# Patient Record
Sex: Male | Born: 1940 | Race: Black or African American | Hispanic: No | Marital: Married | Smoking: Never smoker
Health system: Southern US, Community
[De-identification: ages and names within clinical notes are randomized; demographics above are authoritative.]

## PROBLEM LIST (undated history)

## (undated) DIAGNOSIS — G4730 Sleep apnea, unspecified: Secondary | ICD-10-CM

## (undated) DIAGNOSIS — F0390 Unspecified dementia without behavioral disturbance: Secondary | ICD-10-CM

## (undated) DIAGNOSIS — I639 Cerebral infarction, unspecified: Secondary | ICD-10-CM

## (undated) DIAGNOSIS — J449 Chronic obstructive pulmonary disease, unspecified: Secondary | ICD-10-CM

## (undated) DIAGNOSIS — E119 Type 2 diabetes mellitus without complications: Secondary | ICD-10-CM

## (undated) DIAGNOSIS — I10 Essential (primary) hypertension: Secondary | ICD-10-CM

## (undated) HISTORY — PX: VASCULAR SURGERY: SHX849

---

## 2007-12-04 ENCOUNTER — Ambulatory Visit: Payer: Self-pay | Admitting: Family Medicine

## 2007-12-05 ENCOUNTER — Other Ambulatory Visit: Payer: Self-pay

## 2007-12-06 ENCOUNTER — Inpatient Hospital Stay: Payer: Self-pay | Admitting: Internal Medicine

## 2007-12-14 ENCOUNTER — Emergency Department: Payer: Self-pay | Admitting: Emergency Medicine

## 2008-01-26 ENCOUNTER — Inpatient Hospital Stay: Payer: Self-pay | Admitting: Vascular Surgery

## 2008-08-23 ENCOUNTER — Ambulatory Visit: Payer: Self-pay | Admitting: Oncology

## 2008-09-13 ENCOUNTER — Ambulatory Visit: Payer: Self-pay | Admitting: Oncology

## 2008-09-23 ENCOUNTER — Ambulatory Visit: Payer: Self-pay | Admitting: Oncology

## 2008-10-23 ENCOUNTER — Ambulatory Visit: Payer: Self-pay | Admitting: Oncology

## 2009-01-17 ENCOUNTER — Ambulatory Visit: Payer: Self-pay | Admitting: Oncology

## 2009-01-23 ENCOUNTER — Ambulatory Visit: Payer: Self-pay | Admitting: Oncology

## 2009-03-25 ENCOUNTER — Ambulatory Visit: Payer: Self-pay | Admitting: Oncology

## 2009-04-18 ENCOUNTER — Ambulatory Visit: Payer: Self-pay | Admitting: Oncology

## 2009-04-25 ENCOUNTER — Ambulatory Visit: Payer: Self-pay | Admitting: Oncology

## 2009-09-13 ENCOUNTER — Inpatient Hospital Stay: Payer: Self-pay | Admitting: Specialist

## 2011-03-12 ENCOUNTER — Ambulatory Visit: Payer: Self-pay | Admitting: Internal Medicine

## 2014-12-02 ENCOUNTER — Inpatient Hospital Stay
Admission: EM | Admit: 2014-12-02 | Discharge: 2014-12-06 | DRG: 690 | Disposition: A | Discharge status: Home / Self Care | Payer: Commercial Managed Care - HMO | Attending: Internal Medicine | Admitting: Internal Medicine

## 2014-12-02 ENCOUNTER — Emergency Department: Payer: Commercial Managed Care - HMO

## 2014-12-02 ENCOUNTER — Encounter: Payer: Self-pay | Admitting: Urgent Care

## 2014-12-02 DIAGNOSIS — R400 Somnolence: Secondary | ICD-10-CM | POA: Diagnosis not present

## 2014-12-02 DIAGNOSIS — E785 Hyperlipidemia, unspecified: Secondary | ICD-10-CM | POA: Diagnosis present

## 2014-12-02 DIAGNOSIS — J449 Chronic obstructive pulmonary disease, unspecified: Secondary | ICD-10-CM | POA: Diagnosis present

## 2014-12-02 DIAGNOSIS — J9610 Chronic respiratory failure, unspecified whether with hypoxia or hypercapnia: Secondary | ICD-10-CM | POA: Diagnosis present

## 2014-12-02 DIAGNOSIS — N400 Enlarged prostate without lower urinary tract symptoms: Secondary | ICD-10-CM | POA: Diagnosis present

## 2014-12-02 DIAGNOSIS — R7309 Other abnormal glucose: Secondary | ICD-10-CM | POA: Diagnosis present

## 2014-12-02 DIAGNOSIS — I10 Essential (primary) hypertension: Secondary | ICD-10-CM | POA: Diagnosis present

## 2014-12-02 DIAGNOSIS — I639 Cerebral infarction, unspecified: Secondary | ICD-10-CM | POA: Clinically undetermined

## 2014-12-02 DIAGNOSIS — Z888 Allergy status to other drugs, medicaments and biological substances status: Secondary | ICD-10-CM

## 2014-12-02 DIAGNOSIS — N390 Urinary tract infection, site not specified: Secondary | ICD-10-CM

## 2014-12-02 DIAGNOSIS — Z79899 Other long term (current) drug therapy: Secondary | ICD-10-CM

## 2014-12-02 DIAGNOSIS — R531 Weakness: Secondary | ICD-10-CM

## 2014-12-02 DIAGNOSIS — Z9981 Dependence on supplemental oxygen: Secondary | ICD-10-CM

## 2014-12-02 DIAGNOSIS — E119 Type 2 diabetes mellitus without complications: Secondary | ICD-10-CM

## 2014-12-02 DIAGNOSIS — Z9889 Other specified postprocedural states: Secondary | ICD-10-CM

## 2014-12-02 DIAGNOSIS — Z7982 Long term (current) use of aspirin: Secondary | ICD-10-CM

## 2014-12-02 DIAGNOSIS — G4730 Sleep apnea, unspecified: Secondary | ICD-10-CM | POA: Diagnosis present

## 2014-12-02 DIAGNOSIS — F0390 Unspecified dementia without behavioral disturbance: Secondary | ICD-10-CM | POA: Diagnosis present

## 2014-12-02 DIAGNOSIS — I69354 Hemiplegia and hemiparesis following cerebral infarction affecting left non-dominant side: Secondary | ICD-10-CM

## 2014-12-02 HISTORY — DX: Cerebral infarction, unspecified: I63.9

## 2014-12-02 HISTORY — DX: Unspecified dementia, unspecified severity, without behavioral disturbance, psychotic disturbance, mood disturbance, and anxiety: F03.90

## 2014-12-02 HISTORY — DX: Essential (primary) hypertension: I10

## 2014-12-02 HISTORY — DX: Type 2 diabetes mellitus without complications: E11.9

## 2014-12-02 HISTORY — DX: Chronic obstructive pulmonary disease, unspecified: J44.9

## 2014-12-02 HISTORY — DX: Sleep apnea, unspecified: G47.30

## 2014-12-02 LAB — COMPREHENSIVE METABOLIC PANEL
ALBUMIN: 3.3 g/dL — AB (ref 3.5–5.0)
ALK PHOS: 55 U/L (ref 38–126)
ALT: 9 U/L — AB (ref 17–63)
AST: 17 U/L (ref 15–41)
Anion gap: 13 (ref 5–15)
BILIRUBIN TOTAL: 0.4 mg/dL (ref 0.3–1.2)
BUN: 14 mg/dL (ref 6–20)
CO2: 29 mmol/L (ref 22–32)
Calcium: 9 mg/dL (ref 8.9–10.3)
Chloride: 90 mmol/L — ABNORMAL LOW (ref 101–111)
Creatinine, Ser: 0.94 mg/dL (ref 0.61–1.24)
GFR calc Af Amer: >60 mL/min (ref >60)
GFR calc non Af Amer: >60 mL/min (ref >60)
GLUCOSE: 153 mg/dL — AB (ref 65–99)
POTASSIUM: 3.6 mmol/L (ref 3.5–5.1)
Sodium: 132 mmol/L — ABNORMAL LOW (ref 135–145)
Total Protein: 8.8 g/dL — ABNORMAL HIGH (ref 6.5–8.1)

## 2014-12-02 LAB — CBC
HCT: 38.2 % — ABNORMAL LOW (ref 40.0–52.0)
Hemoglobin: 12.5 g/dL — ABNORMAL LOW (ref 13.0–18.0)
MCH: 26.1 pg (ref 26.0–34.0)
MCHC: 32.7 g/dL (ref 32.0–36.0)
MCV: 79.8 fL — AB (ref 80.0–100.0)
Platelets: 198 10*3/uL (ref 150–440)
RBC: 4.79 MIL/uL (ref 4.40–5.90)
RDW: 16.6 % — ABNORMAL HIGH (ref 11.5–14.5)
WBC: 9.9 10*3/uL (ref 3.8–10.6)

## 2014-12-02 NOTE — ED Notes (Signed)
Attempted I&O urinary cath using 14Fr catheter. Unable to pass catheter through prostate. Patient with small amount of urinary leakage noted in pull up; family reports that patient is continent. MD made aware. Will continue to attempt to collect voided specimen.

## 2014-12-02 NOTE — ED Notes (Signed)
Patient presents to ED 16 via EMS from home. Patient with reports of AMS over the last 2 days. Family reported to EMS that patient has has "ammonia smelling urine." ? Fever at home - "he just felt hot."

## 2014-12-02 NOTE — ED Provider Notes (Signed)
[facility] Emergency Department Provider Note  ____________________________________________  Time seen: 2030  I have reviewed the triage vital signs and the nursing notes.   HISTORY  Chief Complaint Altered Mental Status Weakness, decreased ambulation    HPI Justin Luna is a 74 y.o. male who has a history of a CVA in the past. He is usually able to ambulate around the house. He does not ambulate outside. He is been doing well through the past week, but this morning he developed a weakness that has been progressive and worsening through the day. The family reports this is a significant change. They report that he has difficulty standing. He usually does not need to use a walker in the house but he needed to today. He is quiet and soft-spoken in a way that is worrisome to his family and worrisome to me.     Past Medical History  Diagnosis Date  . Stroke     There are no active problems to display for this patient.   No past surgical history on file.  No current outpatient prescriptions on file.  Allergies Ciprofloxacin; Lisinopril; and Macrodantin  No family history on file.  Social History History  Substance Use Topics  . Smoking status: Never Smoker   . Smokeless tobacco: Not on file  . Alcohol Use: No    Review of Systems  Constitutional: " he felt warm" ENT: Negative for sore throat. Cardiovascular: Negative for chest pain. Respiratory: Negative for shortness of breath. Gastrointestinal: Negative for abdominal pain, vomiting and diarrhea. Genitourinary: Negative for dysuria. Musculoskeletal: positive for general weakness Skin: Negative for rash. Neurological: positive for general weakness and difficulty ambulating. History of CVA. See history of present illness   10-point ROS otherwise negative.  ____________________________________________   PHYSICAL EXAM:  VITAL SIGNS: ED Triage Vitals  Enc Vitals Group     BP  12/02/14 1932 161/94 mmHg     Pulse Rate 12/02/14 1932 94     Resp 12/02/14 1932 18     Temp 12/02/14 1932 98 F (36.7 C)     Temp Source 12/02/14 1932 Oral     SpO2 12/02/14 1928 91 %     Weight 12/02/14 1932 183 lb 12.8 oz (83.371 kg)     Height 12/02/14 1932 6' (1.829 m)     Head Cir --      Peak Flow --      Pain Score 12/02/14 1934 0     Pain Loc --      Pain Edu? --      Excl. in GC? --     Constitutional: Alert, but soft spoken and slow. ENT   Head: Normocephalic and atraumatic.   Nose: No congestion/rhinnorhea.   Mouth/Throat: Mucous membranes are moist. Cardiovascular: Normal rate, regular rhythm. Respiratory: Normal respiratory effort without tachypnea. Breath sounds are clear and equal bilaterally. No wheezes/rales/rhonchi. Gastrointestinal: Soft and nontender. No distention.  Musculoskeletal: Nontender. 4-5 over 5 strength, slow movement.  No noted edema. Neurologic:  Quiet, soft spoken, appears weak. He is able to raise his legs barely off the bed and raise his hands, but all movements are quite slow and he appears generally weak.  Skin:  Skin is warm, dry. No rash noted. Psychiatric: quiet, soft spoken, but able to answer most questions. He speaks slowly and delivered early.  ____________________________________________    LABS (pertinent positives/negatives)  CBC: WBC 9.9, hemoglobin 12.5 Metabolic panel: Sodium 132, potassium 3.6, glucose 153, renal function within normal limits, LFTs  within normal limits UA: Pending ____________________________________________   EKG  ED ECG REPORT I, Lenoard Helbert W, the attending physician, personally viewed and interpreted this ECG.   Date: 12/02/2014  EKG Time: 1947  Rate: 94  Rhythm: sinus rhythm with first-degree block  Axis: within normal limits  Intervals: PR of 210  ST&T Change: none noted   ____________________________________________    RADIOLOGY  CT head: IMPRESSION: No CT evidence of  acute intracranial hemorrhage midline shift, or mass effect.  Sequela of likely prior intraparenchymal hemorrhage of the right frontal lobe with encephalomalacia of the right caudate head and expansion of the right frontal horn.  Mild expansion of the lateral ventricular system, with confluent hypodensity along the margin of the anterior and posterior horns. Changes may reflect chronic microvascular ischemic disease, however, normal pressure hydrocephalus could have this appearance, and if there is concern for NPH, MRI may be considered.   ____________________________________________   INITIAL IMPRESSION / ASSESSMENT AND PLAN / ED COURSE  Unclear cause of this patient's global weakness, slow response, and decreased ability to ambulate. At this hour (2155), the blood tests overall are unremarkable. The head CT shows no acute changes but there are older findings. See CT report.   Currently, a urinalysis is still pending. I will add on an opponent. If there is no clear explanation, I would be worried for an intracranial cause. I will last my colleague, Dr. Shaune Pollack, to follow-up on the urinalysis but I will also speak with the hospitalist to evaluate for admission given the patient's acute change.  ____________________________________________   FINAL CLINICAL IMPRESSION(S) / ED DIAGNOSES  Final diagnoses:  Somnolence  General weakness      Darien Ramus, MD 12/02/14 2208

## 2014-12-03 ENCOUNTER — Encounter: Payer: Self-pay | Admitting: Internal Medicine

## 2014-12-03 ENCOUNTER — Inpatient Hospital Stay: Payer: Commercial Managed Care - HMO

## 2014-12-03 ENCOUNTER — Inpatient Hospital Stay
Admit: 2014-12-03 | Discharge: 2014-12-03 | Disposition: A | Discharge status: Home / Self Care | Payer: Commercial Managed Care - HMO | Attending: Internal Medicine | Admitting: Internal Medicine

## 2014-12-03 DIAGNOSIS — N400 Enlarged prostate without lower urinary tract symptoms: Secondary | ICD-10-CM | POA: Diagnosis present

## 2014-12-03 DIAGNOSIS — Z7982 Long term (current) use of aspirin: Secondary | ICD-10-CM | POA: Diagnosis not present

## 2014-12-03 DIAGNOSIS — G4730 Sleep apnea, unspecified: Secondary | ICD-10-CM | POA: Diagnosis present

## 2014-12-03 DIAGNOSIS — I10 Essential (primary) hypertension: Secondary | ICD-10-CM | POA: Diagnosis present

## 2014-12-03 DIAGNOSIS — Z888 Allergy status to other drugs, medicaments and biological substances status: Secondary | ICD-10-CM | POA: Diagnosis not present

## 2014-12-03 DIAGNOSIS — R400 Somnolence: Secondary | ICD-10-CM | POA: Diagnosis present

## 2014-12-03 DIAGNOSIS — J9610 Chronic respiratory failure, unspecified whether with hypoxia or hypercapnia: Secondary | ICD-10-CM | POA: Diagnosis present

## 2014-12-03 DIAGNOSIS — R531 Weakness: Secondary | ICD-10-CM

## 2014-12-03 DIAGNOSIS — Z9889 Other specified postprocedural states: Secondary | ICD-10-CM | POA: Diagnosis not present

## 2014-12-03 DIAGNOSIS — E785 Hyperlipidemia, unspecified: Secondary | ICD-10-CM | POA: Diagnosis present

## 2014-12-03 DIAGNOSIS — I69354 Hemiplegia and hemiparesis following cerebral infarction affecting left non-dominant side: Secondary | ICD-10-CM | POA: Diagnosis not present

## 2014-12-03 DIAGNOSIS — N390 Urinary tract infection, site not specified: Secondary | ICD-10-CM | POA: Diagnosis present

## 2014-12-03 DIAGNOSIS — F0390 Unspecified dementia without behavioral disturbance: Secondary | ICD-10-CM | POA: Diagnosis present

## 2014-12-03 DIAGNOSIS — J449 Chronic obstructive pulmonary disease, unspecified: Secondary | ICD-10-CM | POA: Diagnosis present

## 2014-12-03 DIAGNOSIS — R7309 Other abnormal glucose: Secondary | ICD-10-CM | POA: Diagnosis present

## 2014-12-03 DIAGNOSIS — Z9981 Dependence on supplemental oxygen: Secondary | ICD-10-CM | POA: Diagnosis not present

## 2014-12-03 DIAGNOSIS — Z79899 Other long term (current) drug therapy: Secondary | ICD-10-CM | POA: Diagnosis not present

## 2014-12-03 LAB — CBC
HEMATOCRIT: 35.7 % — AB (ref 40.0–52.0)
Hemoglobin: 11.5 g/dL — ABNORMAL LOW (ref 13.0–18.0)
MCH: 25.4 pg — ABNORMAL LOW (ref 26.0–34.0)
MCHC: 32.2 g/dL (ref 32.0–36.0)
MCV: 79 fL — AB (ref 80.0–100.0)
Platelets: 176 10*3/uL (ref 150–440)
RBC: 4.52 MIL/uL (ref 4.40–5.90)
RDW: 16.9 % — ABNORMAL HIGH (ref 11.5–14.5)
WBC: 9.4 10*3/uL (ref 3.8–10.6)

## 2014-12-03 LAB — BASIC METABOLIC PANEL
Anion gap: 7 (ref 5–15)
BUN: 13 mg/dL (ref 6–20)
CHLORIDE: 95 mmol/L — AB (ref 101–111)
CO2: 32 mmol/L (ref 22–32)
Calcium: 8.5 mg/dL — ABNORMAL LOW (ref 8.9–10.3)
Creatinine, Ser: 0.84 mg/dL (ref 0.61–1.24)
GFR calc non Af Amer: >60 mL/min (ref >60)
Glucose, Bld: 107 mg/dL — ABNORMAL HIGH (ref 65–99)
Potassium: 3.2 mmol/L — ABNORMAL LOW (ref 3.5–5.1)
SODIUM: 134 mmol/L — AB (ref 135–145)

## 2014-12-03 LAB — GLUCOSE, CAPILLARY
GLUCOSE-CAPILLARY: 89 mg/dL (ref 65–99)
Glucose-Capillary: 95 mg/dL (ref 65–99)
Glucose-Capillary: 94 mg/dL (ref 65–99)
Glucose-Capillary: 98 mg/dL (ref 65–99)

## 2014-12-03 LAB — CREATININE, SERUM
Creatinine, Ser: 0.84 mg/dL (ref 0.61–1.24)
GFR calc non Af Amer: >60 mL/min (ref >60)

## 2014-12-03 LAB — TSH: TSH: 1.523 u[IU]/mL (ref 0.350–4.500)

## 2014-12-03 LAB — MAGNESIUM: Magnesium: 1.6 mg/dL — ABNORMAL LOW (ref 1.7–2.4)

## 2014-12-03 MED ORDER — CLOPIDOGREL BISULFATE 75 MG PO TABS
75.0000 mg | ORAL_TABLET | Freq: Every morning | ORAL | Status: DC
  Administered 2014-12-03 – 2014-12-06 (×4): 75 mg via ORAL
  Filled 2014-12-03 (×4): qty 1

## 2014-12-03 MED ORDER — ONDANSETRON HCL 4 MG/2ML IJ SOLN: 4.0000 mg | Freq: Four times a day (QID) | INTRAMUSCULAR | Status: DC | PRN

## 2014-12-03 MED ORDER — MOMETASONE FURO-FORMOTEROL FUM 100-5 MCG/ACT IN AERO
2.0000 | INHALATION_SPRAY | Freq: Two times a day (BID) | RESPIRATORY_TRACT | Status: DC
  Administered 2014-12-03 – 2014-12-06 (×6): 2 via RESPIRATORY_TRACT
  Filled 2014-12-03 (×2): qty 8.8

## 2014-12-03 MED ORDER — PRAVASTATIN SODIUM 10 MG PO TABS
10.0000 mg | ORAL_TABLET | Freq: Every day | ORAL | Status: DC
  Administered 2014-12-03 – 2014-12-06 (×4): 10 mg via ORAL
  Filled 2014-12-03 (×4): qty 1

## 2014-12-03 MED ORDER — IPRATROPIUM BROMIDE 0.02 % IN SOLN
0.5000 mg | Freq: Four times a day (QID) | RESPIRATORY_TRACT | Status: DC
  Administered 2014-12-03 (×3): 0.5 mg via RESPIRATORY_TRACT
  Filled 2014-12-03 (×3): qty 2.5

## 2014-12-03 MED ORDER — ONDANSETRON HCL 4 MG PO TABS: 4.0000 mg | ORAL_TABLET | Freq: Four times a day (QID) | ORAL | Status: DC | PRN

## 2014-12-03 MED ORDER — METOPROLOL TARTRATE 50 MG PO TABS
50.0000 mg | ORAL_TABLET | Freq: Two times a day (BID) | ORAL | Status: DC
  Administered 2014-12-03 – 2014-12-06 (×8): 50 mg via ORAL
  Filled 2014-12-03 (×9): qty 1

## 2014-12-03 MED ORDER — TAMSULOSIN HCL 0.4 MG PO CAPS
0.4000 mg | ORAL_CAPSULE | Freq: Every day | ORAL | Status: DC
  Administered 2014-12-04 – 2014-12-05 (×2): 0.4 mg via ORAL
  Filled 2014-12-03 (×2): qty 1

## 2014-12-03 MED ORDER — POTASSIUM CHLORIDE CRYS ER 20 MEQ PO TBCR: 40.0000 meq | EXTENDED_RELEASE_TABLET | ORAL | Status: DC | PRN

## 2014-12-03 MED ORDER — LORAZEPAM 2 MG/ML IJ SOLN
INTRAMUSCULAR | Status: AC
  Administered 2014-12-03: 0.5 mg
  Filled 2014-12-03: qty 1

## 2014-12-03 MED ORDER — LORAZEPAM 1 MG PO TABS
1.0000 mg | ORAL_TABLET | Freq: Once | ORAL | Status: DC
  Administered 2014-12-03: 1 mg via ORAL

## 2014-12-03 MED ORDER — ASPIRIN EC 81 MG PO TBEC: 81.0000 mg | DELAYED_RELEASE_TABLET | Freq: Every morning | ORAL | Status: DC

## 2014-12-03 MED ORDER — SODIUM CHLORIDE 0.9 % IJ SOLN
3.0000 mL | Freq: Two times a day (BID) | INTRAMUSCULAR | Status: DC
  Administered 2014-12-03 – 2014-12-06 (×5): 3 mL via INTRAVENOUS

## 2014-12-03 MED ORDER — ACETAMINOPHEN 500 MG PO TABS
500.0000 mg | ORAL_TABLET | Freq: Four times a day (QID) | ORAL | Status: DC | PRN
Start: 2014-12-03 — End: 2014-12-06

## 2014-12-03 MED ORDER — ASPIRIN EC 81 MG PO TBEC
81.0000 mg | DELAYED_RELEASE_TABLET | Freq: Every day | ORAL | Status: DC
  Administered 2014-12-03 – 2014-12-06 (×4): 81 mg via ORAL
  Filled 2014-12-03 (×4): qty 1

## 2014-12-03 MED ORDER — MEMANTINE HCL 10 MG PO TABS
10.0000 mg | ORAL_TABLET | Freq: Two times a day (BID) | ORAL | Status: DC
  Administered 2014-12-03 – 2014-12-06 (×8): 10 mg via ORAL
  Filled 2014-12-03 (×8): qty 1

## 2014-12-03 MED ORDER — DARIFENACIN HYDROBROMIDE ER 7.5 MG PO TB24
15.0000 mg | ORAL_TABLET | Freq: Every day | ORAL | Status: DC
Start: 2014-12-03 — End: 2014-12-06
  Administered 2014-12-03 – 2014-12-05 (×4): 15 mg via ORAL
  Filled 2014-12-03 (×4): qty 2

## 2014-12-03 MED ORDER — SODIUM CHLORIDE 0.9 % IV SOLN
INTRAVENOUS | Status: AC
  Administered 2014-12-03 (×2): via INTRAVENOUS

## 2014-12-03 MED ORDER — LORAZEPAM 2 MG/ML IJ SOLN
0.5000 mg | INTRAMUSCULAR | Status: AC
  Administered 2014-12-03: 0.5 mg via INTRAVENOUS

## 2014-12-03 MED ORDER — BISACODYL 5 MG PO TBEC: 10.0000 mg | DELAYED_RELEASE_TABLET | Freq: Every day | ORAL | Status: DC | PRN

## 2014-12-03 MED ORDER — INSULIN ASPART 100 UNIT/ML ~~LOC~~ SOLN
0.0000 [IU] | Freq: Three times a day (TID) | SUBCUTANEOUS | Status: DC
  Administered 2014-12-04: 1 [IU] via SUBCUTANEOUS
  Filled 2014-12-03: qty 1

## 2014-12-03 MED ORDER — FLUTICASONE PROPIONATE 50 MCG/ACT NA SUSP
1.0000 | Freq: Every day | NASAL | Status: DC | PRN
  Filled 2014-12-03: qty 16

## 2014-12-03 MED ORDER — PANTOPRAZOLE SODIUM 40 MG PO TBEC
40.0000 mg | DELAYED_RELEASE_TABLET | Freq: Every day | ORAL | Status: DC
  Administered 2014-12-03 – 2014-12-06 (×4): 40 mg via ORAL
  Filled 2014-12-03 (×4): qty 1

## 2014-12-03 MED ORDER — TIOTROPIUM BROMIDE MONOHYDRATE 18 MCG IN CAPS
18.0000 ug | ORAL_CAPSULE | Freq: Every morning | RESPIRATORY_TRACT | Status: DC
  Administered 2014-12-03 – 2014-12-06 (×4): 18 ug via RESPIRATORY_TRACT
  Filled 2014-12-03: qty 5

## 2014-12-03 MED ORDER — ENOXAPARIN SODIUM 40 MG/0.4ML ~~LOC~~ SOLN
40.0000 mg | SUBCUTANEOUS | Status: DC
  Administered 2014-12-03 – 2014-12-06 (×4): 40 mg via SUBCUTANEOUS
  Filled 2014-12-03 (×4): qty 0.4

## 2014-12-03 NOTE — Progress Notes (Signed)
[facility] [place] at [facility]   PATIENT NAME: Justin Luna    MR#:  [id number]  DATE OF BIRTH:  Mar 25, 1941  SUBJECTIVE:  CHIEF COMPLAINT:   Chief Complaint  Patient presents with  . Altered Mental Status  Same. No improvement per family. Ambulated with walker at home on own. Is confused at baseline. Chronic left weakness from old CVA  REVIEW OF SYSTEMS:    ROS  Dementia. Cannot obtain.  DRUG ALLERGIES:   Allergies  Allergen Reactions  . Ciprofloxacin Swelling  . Lisinopril Swelling  . Macrodantin [Nitrofurantoin] Swelling    VITALS:  Blood pressure 178/96, pulse 87, temperature 98.4 F (36.9 C), temperature source Oral, resp. rate 20, height 6' (1.829 m), weight 85.866 kg (189 lb 4.8 oz), SpO2 100 %.  PHYSICAL EXAMINATION:   Physical Exam  GENERAL:  74 y.o.-year-old patient lying in the bed with no acute distress.  EYES: Pupils equal, round, reactive to light and accommodation. No scleral icterus. Extraocular muscles intact.  HEENT: Head atraumatic, normocephalic. Oropharynx and nasopharynx clear.  NECK:  Supple, no jugular venous distention. No thyroid enlargement, no tenderness.  LUNGS: Normal breath sounds bilaterally, no wheezing, rales, rhonchi. No use of accessory muscles of respiration.  CARDIOVASCULAR: S1, S2 normal. No murmurs, rubs, or gallops.  ABDOMEN: Soft, nontender, nondistended. Bowel sounds present. No organomegaly or mass.  EXTREMITIES: No cyanosis, clubbing or edema b/l.    NEUROLOGIC: Cranial nerves II through XII are intact. Right motor 4+/5. Left 5/5   PSYCHIATRIC: The patient is drowzy SKIN: No obvious rash, lesion, or ulcer.    LABORATORY PANEL:   CBC  Recent Labs Lab 12/03/14 0633  WBC 9.4  HGB 11.5*  HCT 35.7*  PLT 176   ------------------------------------------------------------------------------------------------------------------  Chemistries   Recent Labs Lab 12/02/14 1942  12/03/14 0632 12/03/14 0633  NA 132* 134*  --   K 3.6 3.2*  --   CL 90* 95*  --   CO2 29 32  --   GLUCOSE 153* 107*  --   BUN 14 13  --   CREATININE 0.94 0.84 0.84  CALCIUM 9.0 8.5*  --   MG  --   --  1.6*  AST 17  --   --   ALT 9*  --   --   ALKPHOS 55  --   --   BILITOT 0.4  --   --    ------------------------------------------------------------------------------------------------------------------  Cardiac Enzymes No results for input(s): TROPONINI in the last 168 hours. ------------------------------------------------------------------------------------------------------------------  RADIOLOGY:  Ct Head Wo Contrast  12/02/2014   CLINICAL DATA:  74 year old male with a history of altered mental status over the past 2 days  EXAM: CT HEAD WITHOUT CONTRAST  TECHNIQUE: Contiguous axial images were obtained from the base of the skull through the vertex without intravenous contrast.  COMPARISON:  None.  FINDINGS: Unremarkable appearance of the calvarium without acute fracture or aggressive lesion.  Unremarkable appearance of the scalp soft tissues.  Unremarkable appearance of the bilateral orbits.  Mastoid air cells are clear.  No significant paranasal sinus disease  No acute intracranial hemorrhage.  No midline shift or mass effect.  Confluent hypodensity at the margins of the lateral ventricles.  Atrophic right caudate head with focal hypodensity in the adjacent brain parenchyma. Expanded appearance of the frontal horn of the right lateral ventricle.  Mild dilation of the ventricular system, with bifrontal diameter measures 3.8 cm. No comparison is available.  Gray-white differentiation is relatively maintained.  IMPRESSION:  No CT evidence of acute intracranial hemorrhage midline shift, or mass effect.  Sequela of likely prior intraparenchymal hemorrhage of the right frontal lobe with encephalomalacia of the right caudate head and expansion of the right frontal horn.  Mild expansion of the  lateral ventricular system, with confluent hypodensity along the margin of the anterior and posterior horns. Changes may reflect chronic microvascular ischemic disease, however, normal pressure hydrocephalus could have this appearance, and if there is concern for NPH, MRI may be considered.  Signed,  Yvone Neu. Loreta Ave, DO  [facility]  [facility]   Electronically Signed   By: Gilmer Mor D.O.   On: 12/02/2014 21:15   US Carotid Bilateral  12/03/2014   CLINICAL DATA:  History of stroke. Weakness, hypertension, diabetes.  EXAM: BILATERAL CAROTID DUPLEX ULTRASOUND  TECHNIQUE: Wallace Cullens scale imaging, color Doppler and duplex ultrasound was performed of bilateral carotid and vertebral arteries in the neck.  COMPARISON:  12/10/2007  REVIEW OF SYSTEMS: Quantification of carotid stenosis is based on velocity parameters that correlate the residual internal carotid diameter with NASCET-based stenosis levels, using the diameter of the distal internal carotid lumen as the denominator for stenosis measurement.  The following velocity measurements were obtained:  PEAK SYSTOLIC/END DIASTOLIC  RIGHT  ICA:                     66/14cm/sec  CCA:                     58/13cm/sec  SYSTOLIC ICA/CCA RATIO:  1.2  DIASTOLIC ICA/CCA RATIO: 1.1  ECA:                     95cm/sec  LEFT  ICA:                     79 /12cm/sec  CCA:                     40/9cm/sec  SYSTOLIC ICA/CCA RATIO:  2.0  DIASTOLIC ICA/CCA RATIO: 1.3  ECA:                     490cm/sec  FINDINGS: RIGHT CAROTID ARTERY: Eccentric partially calcified plaque in the bulb and proximal ICA, without high-grade stenosis. Normal waveforms and color Doppler signal.  RIGHT VERTEBRAL ARTERY:  Normal flow direction and waveform.  LEFT CAROTID ARTERY: Eccentric nonocclusive plaque in the proximal common carotid artery. Circumferential somewhat irregular and partially calcified plaque in the carotid bulb involving proximal internal and external  carotid arteries. There is markedly elevated peak systolic velocities in the proximal external carotid artery just beyond the plaque. In the ICA, at least mild proximal stenosis but normal waveforms and color Doppler signal.  LEFT VERTEBRAL ARTERY: Normal flow direction and waveform.  IMPRESSION: 1. Bilateral carotid bifurcation and proximal ICA plaque, left greater than right, resulting in less than 50% diameter stenosis. 2. High-grade origin stenosis of the left external carotid artery, of uncertain clinical significance.   Electronically Signed   By: Corlis Leak M.D.   On: 12/03/2014 12:14     ASSESSMENT AND PLAN:   * Weakness R/o CVA. Check MRI. Check UA. PT consult. Neuro consult. SNF?  * Dementia - Watch for inpatient delirium *HTN Conitnue home meds.  * COPD with chronic resp failure On2 L O2 at home. Continue inhalers  *BPH Start flomax  * DM  *HL  All the records  are reviewed and case discussed with Care Management/Social Workerr. Management plans discussed with the patient, family and they are in agreement.  CODE STATUS: FULL CODE  DVT Prophylaxis: SCDs  TOTAL TIME TAKING CARE OF THIS PATIENT: 35 minutes.   POSSIBLE D/C IN 1-2 DAYS, DEPENDING ON CLINICAL CONDITION.   Milagros Loll R M.D on 12/03/2014 at 2:01 PM  Between 7am to 6pm - Pager - [phone]  After 6pm go to [email or web address] - password [email or web address] [facility]  [facility]  Office  [phone]  CC: Primary care physician; [facility]

## 2014-12-03 NOTE — Evaluation (Signed)
Clinical/Bedside Swallow Evaluation Patient Details  Name: Justin Luna MRN: [id number] Date of Birth: [birth year]  Today's Date: 12/03/2014 Time: SLP Start Time (ACUTE ONLY): 1230 SLP Stop Time (ACUTE ONLY): 1330 SLP Time Calculation (min) (ACUTE ONLY): 60 min  Past Medical History:  Past Medical History  Diagnosis Date  . Stroke   . Hypertension   . Diabetes mellitus without complication   . COPD (chronic obstructive pulmonary disease)   . Sleep apnea   . Dementia    Past Surgical History:  Past Surgical History  Procedure Laterality Date  . Vascular surgery     HPI:  pt's wife stated pt has not eaten much in the past 3-4 days; decreased appetite. Pt and wife denied any swallowing problems at home prior to admission.    Assessment / Plan / Recommendation Clinical Impression  Pt appeared to present w/ an adequate oropharyngeal phase swallow function w/ all trial consistencies; no overt s/s of aspiration noted w/ all trial consistencies. Pt ate and drank slowly taking his time; wife indicated this was how he ate at home. No oral phase deficits noted; pt cleared orally w/ all trials. Pt required feeding assistance. Pt appears at reduced risk for aspiration at this time following general aspiration precautions. Rec. a Dys. III w/ thin liquids diet w/ meds in puree; aspiration precautions. NSG updated.     Aspiration Risk   (reduced)    Diet Recommendation Dysphagia 3 (Mech soft);Thin   Medication Administration: Whole meds with puree Compensations: Slow rate;Small sips/bites    Other  Recommendations Recommended Consults:  (Dietician) Oral Care Recommendations: Oral care BID;Oral care before and after PO;Staff/trained caregiver to provide oral care   Follow Up Recommendations       Frequency and Duration min 2x/week  1 week   Pertinent Vitals/Pain denied    SLP Swallow Goals  see care plan   Swallow Study Prior Functional Status   pt resided at home w/ wife     General Date of Onset: 12/02/14 Other Pertinent Information: pt's wife stated pt has not eaten much in the past 3-4 days; decreased appetite. Pt and wife denied any swallowing problems at home prior to admission.  Type of Study: Bedside swallow evaluation Previous Swallow Assessment: none indicated Diet Prior to this Study: Regular;Thin liquids Temperature Spikes Noted: No Respiratory Status: Supplemental O2 delivered via (comment) (2 liters chronic) History of Recent Intubation: No Behavior/Cognition: Alert;Cooperative;Requires cueing;Confused Oral Cavity - Dentition: Adequate natural dentition/normal for age;Missing dentition Self-Feeding Abilities: Total assist (and encouragement to take po's/participate) Patient Positioning: Upright in bed Baseline Vocal Quality: Low vocal intensity Volitional Cough: Cognitively unable to elicit Volitional Swallow: Able to elicit    Oral/Motor/Sensory Function Overall Oral Motor/Sensory Function: Appears within functional limits for tasks assessed Labial ROM: Within Functional Limits Labial Symmetry: Within Functional Limits Labial Strength: Within Functional Limits Lingual ROM: Within Functional Limits Lingual Symmetry: Within Functional Limits Lingual Strength: Within Functional Limits Facial Symmetry: Within Functional Limits Velum:  (NT) Mandible: Within Functional Limits   Ice Chips Ice chips: Within functional limits Presentation: Spoon (fed by SLP;  x2 trials)   Thin Liquid Thin Liquid: Within functional limits Presentation: Straw (fed by SLP; 5 trials)    Nectar Thick Nectar Thick Liquid: Not tested   Honey Thick Honey Thick Liquid: Not tested   Puree Puree: Within functional limits Presentation: Spoon (fed by SLP; x4 trials)   Solid   GO    Solid: Within functional limits Presentation: Spoon (fed by SLP; x1  trial)       Justin Luna 12/03/2014,1:42 PM

## 2014-12-03 NOTE — Care Management (Signed)
Presents from home.  Wife and other family members present in room. At baseline, patient is able to walk in the house either without assistive device or uses his wife's stationary walker.  He has cpap but unable to determine based on wife's response at to whether patient has chronic 02 in the home.  Denies issues accessing medical care or obtaining meds.  NieceBeryle Beams [phone] provides transportation to appointments and errands.  Followed by [facility]- Delma Officer PA  and Beverely Risen MD.  Resides in [place].  Confirmed demographics and contact information

## 2014-12-03 NOTE — H&P (Signed)
[facility] - [place] at [facility]   PATIENT NAME: Justin Luna    MR#:  [id number]  DATE OF BIRTH:  1940/10/28  DATE OF ADMISSION:  12/02/2014  PRIMARY CARE PHYSICIAN: [facility]   REQUESTING/REFERRING PHYSICIAN: Janalyn Harder  CHIEF COMPLAINT:   Chief Complaint  Patient presents with  . Altered Mental Status   generalized weakness with the decreased ambulation  HISTORY OF PRESENT ILLNESS:  Justin Luna  is a 74 y.o. male with below mentioned past medical history was brought in by EMS with the complaints of found to have generalized weakness with the decreased aberration noticed in the morning today. According to the patient's wife who is with the patient at this time patient has a baseline dementia but able to take care of himself at home and and ambulates on his own. This morning he was noted to have generalized weakness with inability to walk hence needing walker and the symptoms further worsened later in the evening hence called EMS and brought to the emergency room. Denies any fever, cough, chest pain, shortness of breath, dizziness or loss of consciousness. No history of any nausea, vomiting, diarrhea, dysuria, abdominal pain. Patient is able to speak but his speech is very soft and slow in response. Denies any focal weakness or numbness. In the emergency room patient was evaluated by the ED physician and was found to be alert awake and oriented but slow in response with soft speech and neuro examination was nonfocal. Workup in the ED revealed CBC and CMP within normal limits, EKG normal sinus rhythm with ventricular rate of 94 bpm with no acute ST-T changes. CT of the head done negative for any acute intracranial pathology but positive for findings suggestive of sequelae of prior intracerebral hemorrhage and encephalomalacia. Urinalysis is pending at this time. Hospitalist service was consulted for further management. Patient did current time  is comfortably resting in the bed, alert awake and oriented 3, following commands but slow in response.  PAST MEDICAL HISTORY:   Past Medical History  Diagnosis Date  . Stroke   . Hypertension   . Diabetes mellitus without complication   . COPD (chronic obstructive pulmonary disease)   . Sleep apnea   . Dementia     PAST SURGICAL HISTORY:   Past Surgical History  Procedure Laterality Date  . Vascular surgery      SOCIAL HISTORY:   History  Substance Use Topics  . Smoking status: Never Smoker   . Smokeless tobacco: Not on file  . Alcohol Use: No    FAMILY HISTORY:  History reviewed. No pertinent family history.  DRUG ALLERGIES:   Allergies  Allergen Reactions  . Ciprofloxacin Swelling  . Lisinopril Swelling  . Macrodantin [Nitrofurantoin] Swelling    REVIEW OF SYSTEMS:   Review of Systems  Constitutional: Positive for malaise/fatigue. Negative for fever and chills.  HENT: Negative for ear pain, hearing loss, nosebleeds, sore throat and tinnitus.   Eyes: Negative for blurred vision, double vision, pain, discharge and redness.  Respiratory: Negative for cough, hemoptysis, sputum production, shortness of breath and wheezing.   Cardiovascular: Negative for chest pain, palpitations, orthopnea and leg swelling.  Gastrointestinal: Negative for nausea, vomiting, abdominal pain, diarrhea, constipation, blood in stool and melena.  Genitourinary: Negative for dysuria, urgency, frequency and hematuria.  Musculoskeletal: Negative for back pain, joint pain and neck pain.  Skin: Negative for itching and rash.  Neurological: Positive for weakness. Negative for dizziness, tingling, sensory change, focal weakness  and seizures.  Endo/Heme/Allergies: Does not bruise/bleed easily.  Psychiatric/Behavioral: Negative for depression. The patient is not nervous/anxious.     MEDICATIONS AT HOME:   Prior to Admission medications   Medication Sig Start Date End Date Taking?  Authorizing Provider  acetaminophen (TYLENOL) 500 MG tablet Take 500 mg by mouth every 6 (six) hours as needed for mild pain.   Yes Historical Provider, MD  aspirin EC 81 MG tablet Take 81 mg by mouth every morning.   Yes Historical Provider, MD  clopidogrel (PLAVIX) 75 MG tablet Take 75 mg by mouth every morning.   Yes Historical Provider, MD  fluticasone (FLONASE) 50 MCG/ACT nasal spray Place 1 spray into both nostrils daily as needed (for congestion).   Yes Historical Provider, MD  Fluticasone-Salmeterol (ADVAIR) 250-50 MCG/DOSE AEPB Inhale 1 puff into the lungs 2 (two) times daily.   Yes Historical Provider, MD  lovastatin (MEVACOR) 40 MG tablet Take 40 mg by mouth at bedtime.   Yes Historical Provider, MD  memantine (NAMENDA) 10 MG tablet Take 10 mg by mouth 2 (two) times daily.   Yes Historical Provider, MD  metoprolol (LOPRESSOR) 50 MG tablet Take 50 mg by mouth 2 (two) times daily.   Yes Historical Provider, MD  omeprazole (PRILOSEC) 40 MG capsule Take 40 mg by mouth at bedtime.   Yes Historical Provider, MD  tiotropium (SPIRIVA) 18 MCG inhalation capsule Place 18 mcg into inhaler and inhale every morning.   Yes Historical Provider, MD  traMADol (ULTRAM) 50 MG tablet Take 50 mg by mouth 2 (two) times daily as needed for moderate pain.   Yes Historical Provider, MD  Trospium Chloride 60 MG CP24 Take 1 capsule by mouth at bedtime.   Yes Historical Provider, MD      VITAL SIGNS:  Blood pressure 160/93, pulse 96, temperature 98 F (36.7 C), temperature source Oral, resp. rate 19, height 6' (1.829 m), weight 83.371 kg (183 lb 12.8 oz), SpO2 95 %.  PHYSICAL EXAMINATION:  Physical Exam  Constitutional: He is oriented to person, place, and time. He appears well-developed and well-nourished. No distress.  HENT:  Head: Normocephalic and atraumatic.  Right Ear: External ear normal.  Left Ear: External ear normal.  Nose: Nose normal.  Mouth/Throat: Oropharynx is clear and moist. No  oropharyngeal exudate.  Eyes: EOM are normal. Pupils are equal, round, and reactive to light. No scleral icterus.  Neck: Normal range of motion. Neck supple. No JVD present. No thyromegaly present.  Cardiovascular: Normal rate, regular rhythm, normal heart sounds and intact distal pulses.  Exam reveals no friction rub.   No murmur heard. Respiratory: Effort normal and breath sounds normal. No respiratory distress. He has no wheezes. He has no rales. He exhibits no tenderness.  GI: Soft. Bowel sounds are normal. He exhibits no distension and no mass. There is no tenderness. There is no rebound and no guarding.  Musculoskeletal: Normal range of motion. He exhibits no edema.  Lymphadenopathy:    He has no cervical adenopathy.  Neurological: He is alert and oriented to person, place, and time. He has normal reflexes. He displays normal reflexes. No cranial nerve deficit. He exhibits normal muscle tone.  Slow in response but following commands well.  Skin: Skin is warm. No rash noted. No erythema.  Psychiatric: He has a normal mood and affect.   LABORATORY PANEL:   CBC  Recent Labs Lab 12/02/14 1942  WBC 9.9  HGB 12.5*  HCT 38.2*  PLT 198   ------------------------------------------------------------------------------------------------------------------  Chemistries   Recent Labs Lab 12/02/14 1942  NA 132*  K 3.6  CL 90*  CO2 29  GLUCOSE 153*  BUN 14  CREATININE 0.94  CALCIUM 9.0  AST 17  ALT 9*  ALKPHOS 55  BILITOT 0.4   ------------------------------------------------------------------------------------------------------------------  Cardiac Enzymes No results for input(s): TROPONINI in the last 168 hours. ------------------------------------------------------------------------------------------------------------------  RADIOLOGY:  Ct Head Wo Contrast  12/02/2014   CLINICAL DATA:  74 year old male with a history of altered mental status over the past 2 days  EXAM:  CT HEAD WITHOUT CONTRAST  TECHNIQUE: Contiguous axial images were obtained from the base of the skull through the vertex without intravenous contrast.  COMPARISON:  None.  FINDINGS: Unremarkable appearance of the calvarium without acute fracture or aggressive lesion.  Unremarkable appearance of the scalp soft tissues.  Unremarkable appearance of the bilateral orbits.  Mastoid air cells are clear.  No significant paranasal sinus disease  No acute intracranial hemorrhage.  No midline shift or mass effect.  Confluent hypodensity at the margins of the lateral ventricles.  Atrophic right caudate head with focal hypodensity in the adjacent brain parenchyma. Expanded appearance of the frontal horn of the right lateral ventricle.  Mild dilation of the ventricular system, with bifrontal diameter measures 3.8 cm. No comparison is available.  Gray-white differentiation is relatively maintained.  IMPRESSION: No CT evidence of acute intracranial hemorrhage midline shift, or mass effect.  Sequela of likely prior intraparenchymal hemorrhage of the right frontal lobe with encephalomalacia of the right caudate head and expansion of the right frontal horn.  Mild expansion of the lateral ventricular system, with confluent hypodensity along the margin of the anterior and posterior horns. Changes may reflect chronic microvascular ischemic disease, however, normal pressure hydrocephalus could have this appearance, and if there is concern for NPH, MRI may be considered.  Signed,  Yvone Neu. Loreta Ave, DO  [facility]  [facility]   Electronically Signed   By: Gilmer Mor D.O.   On: 12/02/2014 21:15    EKG:   Orders placed or performed during the hospital encounter of 12/02/14  . ED EKG normal sinus rhythm with ventricular rate of 94 bpm, first-degree AV block. No acute ST-T changes.   . ED EKG    IMPRESSION AND PLAN:   1. Generalized weakness with difficulty in ambulation. Workup in  the ED so far negative, CT head noncontrast was negative for an acute IC pathology but positive for chronic changes with encephalomalacia. Rule out CVA,? Worsening dementia, rule out metabolic causes, doubt infectious etiology-urinalysis pending at this time 2. History of prior CVA. 3. History of dementia. 4. Hypertension stable on home medications. 5. COPD, stable on home medications. No acute problems. 6. Sleep apnea on CPAP at home and O2 supplementation at night, stable. 7. Benign prostatic hypertrophy, stable on medications. 8. Borderline diabetes mellitus type 2, diet controlled. 9. Hyperlipidemia on statin stable.  Plan: Admit to telemetry, neuro watch, nothing by mouth for now pending swallowing evaluation, continue aspirin, Plavix, statin. Order MRI of the brain, bilateral carotid Dopplers, echocardiogram. Request neuro consultation for further advice. Continue home medications for hypertension, COPD, hyperlipidemia, BPH. Sliding scale insulin, follow-up blood sugars. CPAP at night, O2 supplementation at night.    All the records are reviewed and case discussed with ED provider. Management plans discussed with the patient, family and they are in agreement.  CODE STATUS: Full code  TOTAL TIME TAKING CARE OF THIS PATIENT: 50 minutes.    Rooney Gladwin,  Gilliam Hawkes N M.D on 12/03/2014 at 12:10 AM  Between 7am to 6pm - Pager - [phone]  After 6pm go to [email or web address] - password [email or web address] [facility]  [facility]  Office  [phone]  CC: Primary care physician; [facility]

## 2014-12-03 NOTE — ED Notes (Signed)
Per admitting physician ,"ok to send pt to floor without urinalysis, give patient time to urinate on his own"

## 2014-12-03 NOTE — Consult Note (Signed)
HPI: Justin Luna is an 74 y.o. malewas brought in by EMS with the complaints of found to have generalized weakness with the decreased aberration noticed since yesterday. According to the patient's wife who is with the patient at this time patient has a baseline dementia but able to take care of himself at home and and ambulates on his own. Since yesterday he was noted to have generalized weakness with inability to walk hence needing walker and the symptoms further worsened later in the evening hence called EMS and brought to the emergency room.      Past Medical History  Diagnosis Date  . Stroke   . Hypertension   . Diabetes mellitus without complication   . COPD (chronic obstructive pulmonary disease)   . Sleep apnea   . Dementia     Past Surgical History  Procedure Laterality Date  . Vascular surgery      History reviewed. No pertinent family history. Social History:  reports that he has never smoked. He does not have any smokeless tobacco history on file. He reports that he does not drink alcohol or use illicit drugs.  Allergies:  Allergies  Allergen Reactions  . Ciprofloxacin Swelling  . Lisinopril Swelling  . Macrodantin [Nitrofurantoin] Swelling    Medications: I have reviewed the patient's current medications.   Physical Examination: Blood pressure 178/96, pulse 87, temperature 98.4 F (36.9 C), temperature source Oral, resp. rate 20, height 6' (1.829 m), weight 85.866 kg (189 lb 4.8 oz), SpO2 100 %.   Neurological Examination Mental Status: Alert, oriented, thought content appropriate.  Speech fluent without evidence of aphasia.  Able to follow 3 step commands without difficulty. Cranial Nerves: II: Discs flat bilaterally; Visual fields grossly normal, pupils equal, round, reactive to light and accommodation III,IV, VI: ptosis not present, extra-ocular motions intact bilaterally V,VII: smile symmetric, facial light touch sensation normal bilaterally VIII:  hearing normal bilaterally IX,X: gag reflex present XI: bilateral shoulder shrug XII: midline tongue extension Motor: Right : Upper extremity   4+/5    Left:     Upper extremity   4+/5  Lower extremity   4/5     Lower extremity   4/5 Tone and bulk:normal tone throughout; no atrophy noted Sensory: Pinprick and light touch intact throughout, bilaterally Deep Tendon Reflexes: 1+ and symmetric throughout Plantars: Right: downgoing   Left: downgoing Cerebellar: normal finger-to-nose, normal rapid alternating movements and normal heel-to-shin test     Laboratory Studies:  Basic Metabolic Panel:  Recent Labs Lab 12/02/14 1942 12/03/14 0632 12/03/14 0633  NA 132* 134*  --   K 3.6 3.2*  --   CL 90* 95*  --   CO2 29 32  --   GLUCOSE 153* 107*  --   BUN 14 13  --   CREATININE 0.94 0.84 0.84  CALCIUM 9.0 8.5*  --   MG  --   --  1.6*    Liver Function Tests:  Recent Labs Lab 12/02/14 1942  AST 17  ALT 9*  ALKPHOS 55  BILITOT 0.4  PROT 8.8*  ALBUMIN 3.3*   No results for input(s): LIPASE, AMYLASE in the last 168 hours. No results for input(s): AMMONIA in the last 168 hours.  CBC:  Recent Labs Lab 12/02/14 1942 12/03/14 0633  WBC 9.9 9.4  HGB 12.5* 11.5*  HCT 38.2* 35.7*  MCV 79.8* 79.0*  PLT 198 176    Cardiac Enzymes: No results for input(s): CKTOTAL, CKMB, CKMBINDEX, TROPONINI in the last 168 hours.  BNP: Invalid input(s): POCBNP  CBG:  Recent Labs Lab 12/03/14 0812 12/03/14 1341  GLUCAP 95 89    Microbiology: No results found for this or any previous visit.  Coagulation Studies: No results for input(s): LABPROT, INR in the last 72 hours.  Urinalysis: No results for input(s): COLORURINE, LABSPEC, PHURINE, GLUCOSEU, HGBUR, BILIRUBINUR, KETONESUR, PROTEINUR, UROBILINOGEN, NITRITE, LEUKOCYTESUR in the last 168 hours.  Invalid input(s): APPERANCEUR  Lipid Panel: No results found for: CHOL, TRIG, HDL, CHOLHDL, VLDL, LDLCALC  HgbA1C: No  results found for: HGBA1C  Urine Drug Screen:  No results found for: LABOPIA, COCAINSCRNUR, LABBENZ, AMPHETMU, THCU, LABBARB  Alcohol Level: No results for input(s): ETH in the last 168 hours.   Imaging: Ct Head Wo Contrast  12/02/2014   CLINICAL DATA:  74 year old male with a history of altered mental status over the past 2 days  EXAM: CT HEAD WITHOUT CONTRAST  TECHNIQUE: Contiguous axial images were obtained from the base of the skull through the vertex without intravenous contrast.  COMPARISON:  None.  FINDINGS: Unremarkable appearance of the calvarium without acute fracture or aggressive lesion.  Unremarkable appearance of the scalp soft tissues.  Unremarkable appearance of the bilateral orbits.  Mastoid air cells are clear.  No significant paranasal sinus disease  No acute intracranial hemorrhage.  No midline shift or mass effect.  Confluent hypodensity at the margins of the lateral ventricles.  Atrophic right caudate head with focal hypodensity in the adjacent brain parenchyma. Expanded appearance of the frontal horn of the right lateral ventricle.  Mild dilation of the ventricular system, with bifrontal diameter measures 3.8 cm. No comparison is available.  Gray-white differentiation is relatively maintained.  IMPRESSION: No CT evidence of acute intracranial hemorrhage midline shift, or mass effect.  Sequela of likely prior intraparenchymal hemorrhage of the right frontal lobe with encephalomalacia of the right caudate head and expansion of the right frontal horn.  Mild expansion of the lateral ventricular system, with confluent hypodensity along the margin of the anterior and posterior horns. Changes may reflect chronic microvascular ischemic disease, however, normal pressure hydrocephalus could have this appearance, and if there is concern for NPH, MRI may be considered.  Signed,  Yvone Neu. Loreta Ave, DO  [facility]  [facility]   Electronically Signed    By: Gilmer Mor D.O.   On: 12/02/2014 21:15   US Carotid Bilateral  12/03/2014   CLINICAL DATA:  History of stroke. Weakness, hypertension, diabetes.  EXAM: BILATERAL CAROTID DUPLEX ULTRASOUND  TECHNIQUE: Wallace Cullens scale imaging, color Doppler and duplex ultrasound was performed of bilateral carotid and vertebral arteries in the neck.  COMPARISON:  12/10/2007  REVIEW OF SYSTEMS: Quantification of carotid stenosis is based on velocity parameters that correlate the residual internal carotid diameter with NASCET-based stenosis levels, using the diameter of the distal internal carotid lumen as the denominator for stenosis measurement.  The following velocity measurements were obtained:  PEAK SYSTOLIC/END DIASTOLIC  RIGHT  ICA:                     66/14cm/sec  CCA:                     58/13cm/sec  SYSTOLIC ICA/CCA RATIO:  1.2  DIASTOLIC ICA/CCA RATIO: 1.1  ECA:                     95cm/sec  LEFT  ICA:  79 /12cm/sec  CCA:                     40/9cm/sec  SYSTOLIC ICA/CCA RATIO:  2.0  DIASTOLIC ICA/CCA RATIO: 1.3  ECA:                     490cm/sec  FINDINGS: RIGHT CAROTID ARTERY: Eccentric partially calcified plaque in the bulb and proximal ICA, without high-grade stenosis. Normal waveforms and color Doppler signal.  RIGHT VERTEBRAL ARTERY:  Normal flow direction and waveform.  LEFT CAROTID ARTERY: Eccentric nonocclusive plaque in the proximal common carotid artery. Circumferential somewhat irregular and partially calcified plaque in the carotid bulb involving proximal internal and external carotid arteries. There is markedly elevated peak systolic velocities in the proximal external carotid artery just beyond the plaque. In the ICA, at least mild proximal stenosis but normal waveforms and color Doppler signal.  LEFT VERTEBRAL ARTERY: Normal flow direction and waveform.  IMPRESSION: 1. Bilateral carotid bifurcation and proximal ICA plaque, left greater than right, resulting in less than 50% diameter  stenosis. 2. High-grade origin stenosis of the left external carotid artery, of uncertain clinical significance.   Electronically Signed   By: Corlis Leak M.D.   On: 12/03/2014 12:14    Assessment: 74 y.o. male   male brought in by EMS with the complaints of found to have generalized weakness with the decreased aberration noticed since yesterday. According to the patient's wife who is with the patient at this time patient has a baseline dementia but able to take care of himself at home and and ambulates on his own. Since yesterday he was noted to have generalized weakness with inability to walk hence needing walker and the symptoms further worsened later in the evening hence called EMS and brought to the emergency room.    Pt has generalized weakness in his lower extremities.  MRI brain reviewed preliminary I don't see any acute abnormalities on diffusion and ADC sequences.    - do no think this is a stroke - on home O2 - pt/ot - UA - d/c planning - no further imaging from neuro stand point.    12/03/2014, 2:49 PM

## 2014-12-03 NOTE — Progress Notes (Signed)
Initial Nutrition Assessment  DOCUMENTATION CODES:     INTERVENTION: Medical Nutrition Supplement: Ensure Enlive (each supplement provides 350kcal and 20 grams of protein) BID between meals Meals and Snacks: Cater to patient preferences  NUTRITION DIAGNOSIS:  Inadequate oral intake related to acute illness as evidenced by per patient/family report, percent weight loss x last 2 months  GOAL:  Patient will meet greater than or equal to 90% of their needs  MONITOR:   (Energy Intake, Supplement Acceptance, Electrolyte and renal Profile)  REASON FOR ASSESSMENT:  Consult Poor PO  ASSESSMENT:  Reason For Admission: generalized weakness, AMS PMHx:  Past Medical History  Diagnosis Date  . Stroke   . Hypertension   . Diabetes mellitus without complication   . COPD (chronic obstructive pulmonary disease)   . Sleep apnea   . Dementia     Typical Fluid/ Food Intake: new diet order just started  Meal/ Snack Patterns: Wife at bedside reports that patient has had a decreased intake x last 1-2 months. She states that he was eating 3 meals per day and now he is eating just 2 and maybe some snacks sometimes. Supplements: None  Labs:  Protein Profile:  Recent Labs Lab 12/02/14 1942  ALBUMIN 3.3*   Electrolyte and Renal Profile:  Recent Labs Lab 12/02/14 1942 12/03/14 0632 12/03/14 0633  BUN 14 13  --   CREATININE 0.94 0.84 0.84  NA 132* 134*  --   K 3.6 3.2*  --   MG  --   --  1.6*   Glucose Profile:  Recent Labs  12/03/14 0812 12/03/14 1341  GLUCAP 95 89    Meds: NS @ 75/ hr   Physical Findings: Nutrition-Focused physical exam completed. Findings are WDL for fat depletion, muscle depletion, and edema.   Weight Changes: Wife reports that patient was 202# x 2 months ago at [employer] office. Current weight is a 13# (6.4%) weight loss x 2 months, which is concerning given that the wife reports that his intake has also decreased in same time  frame.  Height:  Ht Readings from Last 1 Encounters:  12/02/14 6' (1.829 m)    Weight:  Wt Readings from Last 1 Encounters:  12/03/14 189 lb 4.8 oz (85.866 kg)    Ideal Body Weight:     Wt Readings from Last 10 Encounters:  12/03/14 189 lb 4.8 oz (85.866 kg)    BMI:  Body mass index is 25.67 kg/(m^2).  Estimated Nutritional Needs:  Kcal:  2426-8341 kcal/ day (BEE: 1647 x 1.2 AF x 1.0-1.2 IF)  Protein:  85-103 g Pro/day (1.0-1.2 g Pro/kg/day)  Fluid:  2150-2580 ml/ day (25-30 ml/ kg)  Skin:  Reviewed, no issues  Diet Order:  DIET DYS 3 Room service appropriate?: Yes with Assist; Fluid consistency:: Thin  EDUCATION NEEDS:  No education needs identified at this time   Intake/Output Summary (Last 24 hours) at 12/03/14 1415 Last data filed at 12/03/14 0659  Gross per 24 hour  Intake 398.75 ml  Output    100 ml  Net 298.75 ml    Last BM:  6/6  Joeseph Amor, RDN Pager: [phone] Office: [phone]  MODERATE Care Level

## 2014-12-03 NOTE — Progress Notes (Signed)
*  PRELIMINARY RESULTS* Echocardiogram 2D Echocardiogram has been performed.  Justin Luna 12/03/2014, 10:09 AM

## 2014-12-03 NOTE — Progress Notes (Signed)
PT Cancellation Note  Patient Details Name: Justin Luna MRN: [id number] DOB: 1940/12/18   Cancelled Treatment:    Reason Eval/Treat Not Completed: Patient at procedure or test/unavailable. Pt currently out of room. Will re-attempt next date.  Elizabeth Palau, PT, DPT [phone]    Ysidra Sopher 12/03/2014, 2:19 PM

## 2014-12-04 LAB — URINALYSIS COMPLETE WITH MICROSCOPIC (ARMC ONLY)
Bilirubin Urine: NEGATIVE
GLUCOSE, UA: NEGATIVE mg/dL
Ketones, ur: NEGATIVE mg/dL
Nitrite: NEGATIVE
PROTEIN: 100 mg/dL — AB
Specific Gravity, Urine: 1.016 (ref 1.005–1.030)
pH: 5 (ref 5.0–8.0)

## 2014-12-04 LAB — GLUCOSE, CAPILLARY
GLUCOSE-CAPILLARY: 130 mg/dL — AB (ref 65–99)
GLUCOSE-CAPILLARY: 80 mg/dL (ref 65–99)
GLUCOSE-CAPILLARY: 155 mg/dL — AB (ref 65–99)
Glucose-Capillary: 97 mg/dL (ref 65–99)

## 2014-12-04 NOTE — Progress Notes (Signed)
Pt placed on Auto CPAP C-1 full face mask. 2L O2 inline. Unable to place pt on his home CPAP due to condition of CPAP. Multiple pieces to the CPAP were in the bag. I was unable to correct place each piece.

## 2014-12-04 NOTE — Progress Notes (Signed)
Talked with dr Elisabeth Pigeon regarding not being able to pass the catheter to collect the urine for the urine specimen.  Urology consult request and he said that he will write an urology consult.  Pt is heavily incontinent otherwise.

## 2014-12-04 NOTE — Progress Notes (Signed)
[facility] - [place] at [facility]   PATIENT NAME: Justin Luna    MR#:  [id number]  DATE OF BIRTH:  [birth year]  SUBJECTIVE:  CHIEF COMPLAINT:   Chief Complaint  Patient presents with  . Altered Mental Status  calm and appears oriented today, no complains. Ambulated with walker at home on own. Is confused at baseline. 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 Negative for fever and chills.  HENT: Negative for congestion and sore throat.   Eyes: Negative for blurred vision and pain.  Respiratory: Negative for cough, sputum production and shortness of breath.   Cardiovascular: Negative for chest pain, palpitations and leg swelling.  Gastrointestinal: Negative for nausea, vomiting, abdominal pain and diarrhea.  Musculoskeletal: Negative for myalgias and joint pain.  Skin: Negative for rash.  Neurological: Negative for dizziness, focal weakness and headaches.  Psychiatric/Behavioral: Positive for memory loss.     DRUG ALLERGIES:   Allergies  Allergen Reactions  . Ciprofloxacin Swelling  . Lisinopril Swelling  . Macrodantin [Nitrofurantoin] Swelling    VITALS:  Blood pressure 115/63, pulse 82, temperature 98.1 F (36.7 C), temperature source Oral, resp. rate 19, height 6' (1.829 m), weight 84.687 kg (186 lb 11.2 oz), SpO2 96 %.  PHYSICAL EXAMINATION:   Physical Exam  GENERAL:  74 y.o.-year-old patient lying in the bed with no acute distress.  EYES: Pupils equal, round, reactive to light and accommodation. No scleral icterus. Extraocular muscles intact.  HEENT: Head atraumatic, normocephalic. Oropharynx and nasopharynx clear.  NECK:  Supple, no jugular venous distention. No thyroid enlargement, no tenderness.  LUNGS: Normal breath sounds bilaterally, no wheezing, rales, rhonchi. No use of accessory muscles of respiration.  CARDIOVASCULAR: S1, S2 normal. No  murmurs, rubs, or gallops.  ABDOMEN: Soft, nontender, nondistended. Bowel sounds present. No organomegaly or mass.  EXTREMITIES: No cyanosis, clubbing or edema b/l.    NEUROLOGIC: Cranial nerves II through XII are intact. Right motor 4+/5. Left 5/5   PSYCHIATRIC: The patient is alert and calm. SKIN: No obvious rash, lesion, or ulcer.    LABORATORY PANEL:   CBC  Recent Labs Lab 12/03/14 0633  WBC 9.4  HGB 11.5*  HCT 35.7*  PLT 176   ------------------------------------------------------------------------------------------------------------------  Chemistries   Recent Labs Lab 12/02/14 1942 12/03/14 0632 12/03/14 0633  NA 132* 134*  --   K 3.6 3.2*  --   CL 90* 95*  --   CO2 29 32  --   GLUCOSE 153* 107*  --   BUN 14 13  --   CREATININE 0.94 0.84 0.84  CALCIUM 9.0 8.5*  --   MG  --   --  1.6*  AST 17  --   --   ALT 9*  --   --   ALKPHOS 55  --   --   BILITOT 0.4  --   --    ------------------------------------------------------------------------------------------------------------------  Cardiac Enzymes No results for input(s): TROPONINI in the last 168 hours. ------------------------------------------------------------------------------------------------------------------  RADIOLOGY:  Ct Head Wo Contrast  12/02/2014   CLINICAL DATA:  74 year old male with a history of altered mental status over the past 2 days  EXAM: CT HEAD WITHOUT CONTRAST  TECHNIQUE: Contiguous axial images were obtained from the base of the skull through the vertex without intravenous contrast.  COMPARISON:  None.  FINDINGS: Unremarkable appearance of the calvarium without acute fracture or aggressive lesion.  Unremarkable appearance of the scalp  soft tissues.  Unremarkable appearance of the bilateral orbits.  Mastoid air cells are clear.  No significant paranasal sinus disease  No acute intracranial hemorrhage.  No midline shift or mass effect.  Confluent hypodensity at the margins of the lateral  ventricles.  Atrophic right caudate head with focal hypodensity in the adjacent brain parenchyma. Expanded appearance of the frontal horn of the right lateral ventricle.  Mild dilation of the ventricular system, with bifrontal diameter measures 3.8 cm. No comparison is available.  Gray-white differentiation is relatively maintained.  IMPRESSION: No CT evidence of acute intracranial hemorrhage midline shift, or mass effect.  Sequela of likely prior intraparenchymal hemorrhage of the right frontal lobe with encephalomalacia of the right caudate head and expansion of the right frontal horn.  Mild expansion of the lateral ventricular system, with confluent hypodensity along the margin of the anterior and posterior horns. Changes may reflect chronic microvascular ischemic disease, however, normal pressure hydrocephalus could have this appearance, and if there is concern for NPH, MRI may be considered.  Signed,  Yvone Neu. Loreta Ave, DO  [facility]  [facility]   Electronically Signed   By: Gilmer Mor D.O.   On: 12/02/2014 21:15   Mr Brain Wo Contrast  12/03/2014   CLINICAL DATA:  74 year old male with generalized weakness, gradually unable to walk over the course of the day. Initial encounter.  EXAM: MRI HEAD WITHOUT CONTRAST  TECHNIQUE: Multiplanar, multiecho pulse sequences of the brain and surrounding structures were obtained without intravenous contrast.  COMPARISON:  Head CT without contrast 12/02/2014. Report of the brain MRI 12/08/2007 (no images available).  FINDINGS: Study is mildly degraded by motion artifact despite repeated imaging attempts.  Cerebral volume is within normal limits for age. No restricted diffusion or evidence of acute infarction.  No ventriculomegaly. There is ex vacuo enlargement of the frontal horns which appears related to chronic lacunar infarcts of the left corona radiata and right basal ganglia. Associated hemosiderin. No midline shift,  mass effect, or evidence of intracranial mass lesion. There are small chronic lacunar infarcts also in both cerebellar hemispheres. There is patchy T2 hyperintensity in the pons. Major intracranial vascular flow voids are grossly preserved. No acute intracranial hemorrhage identified. T1 weighted imaging also demonstrates more generalized T1 heterogeneity in the deep gray matter nuclei including the left thalamus. No other chronic blood products identified. Negative pituitary, cervicomedullary junction and visualized cervical spine.  Visualized paranasal sinuses and mastoids are clear. Grossly normal orbit and scalp soft tissues. Visualized bone marrow signal is within normal limits.  IMPRESSION: 1. Intermittently motion degraded exam with no acute intracranial abnormality. 2. Chronic small vessel ischemia.   Electronically Signed   By: Odessa Fleming M.D.   On: 12/03/2014 14:50   US Carotid Bilateral  12/03/2014   CLINICAL DATA:  History of stroke. Weakness, hypertension, diabetes.  EXAM: BILATERAL CAROTID DUPLEX ULTRASOUND  TECHNIQUE: Wallace Cullens scale imaging, color Doppler and duplex ultrasound was performed of bilateral carotid and vertebral arteries in the neck.  COMPARISON:  12/10/2007  REVIEW OF SYSTEMS: Quantification of carotid stenosis is based on velocity parameters that correlate the residual internal carotid diameter with NASCET-based stenosis levels, using the diameter of the distal internal carotid lumen as the denominator for stenosis measurement.  The following velocity measurements were obtained:  PEAK SYSTOLIC/END DIASTOLIC  RIGHT  ICA:                     66/14cm/sec  CCA:  58/13cm/sec  SYSTOLIC ICA/CCA RATIO:  1.2  DIASTOLIC ICA/CCA RATIO: 1.1  ECA:                     95cm/sec  LEFT  ICA:                     79 /12cm/sec  CCA:                     40/9cm/sec  SYSTOLIC ICA/CCA RATIO:  2.0  DIASTOLIC ICA/CCA RATIO: 1.3  ECA:                     490cm/sec  FINDINGS: RIGHT CAROTID ARTERY:  Eccentric partially calcified plaque in the bulb and proximal ICA, without high-grade stenosis. Normal waveforms and color Doppler signal.  RIGHT VERTEBRAL ARTERY:  Normal flow direction and waveform.  LEFT CAROTID ARTERY: Eccentric nonocclusive plaque in the proximal common carotid artery. Circumferential somewhat irregular and partially calcified plaque in the carotid bulb involving proximal internal and external carotid arteries. There is markedly elevated peak systolic velocities in the proximal external carotid artery just beyond the plaque. In the ICA, at least mild proximal stenosis but normal waveforms and color Doppler signal.  LEFT VERTEBRAL ARTERY: Normal flow direction and waveform.  IMPRESSION: 1. Bilateral carotid bifurcation and proximal ICA plaque, left greater than right, resulting in less than 50% diameter stenosis. 2. High-grade origin stenosis of the left external carotid artery, of uncertain clinical significance.   Electronically Signed   By: Corlis Leak M.D.   On: 12/03/2014 12:14     ASSESSMENT AND PLAN:   * Weakness Ruled out CVA as negative  MRI. Check UA- pt is incontinent. As per PT- need SNF placement.  * Dementia - Watch for inpatient delirium *HTN Conitnue home meds.  * COPD with chronic resp failure On2 L O2 at home. Continue inhalers  *BPH Start flomax Nurses not able to pass catheter- to get urine sample- though pt is incontinent.   Will call urology- pt is on flomax.  * DM- insulin with coverage.  *HL  All the records are reviewed and case discussed with Care Management/Social Workerr. Management plans discussed with the patient, family and they are in agreement.  CODE STATUS: FULL CODE  DVT Prophylaxis: SCDs  TOTAL TIME TAKING CARE OF THIS PATIENT: 35 minutes.   POSSIBLE D/C IN 1-2 DAYS, DEPENDING ON CLINICAL CONDITION. Need SNF placement.   Altamese Dilling M.D on 12/04/2014 at 3:14 PM  Between 7am to 6pm - Pager - [phone] After  6pm go to [email or web address] - password [email or web address] [facility]  [place] [facility]  Office  [phone]  CC: Primary care physician; [facility]

## 2014-12-04 NOTE — Consult Note (Signed)
HPI: Justin Luna is an 74 y.o. malewas brought in by EMS with the complaints of found to have generalized weakness with the decreased aberration noticed since yesterday. According to the patient's wife who is with the patient at this time patient has a baseline dementia but able to take care of himself at home and and ambulates on his own. Since yesterday he was noted to have generalized weakness with inability to walk hence needing walker and the symptoms further worsened later in the evening hence called EMS and brought to the emergency room.    Mental status improved.   Past Medical History  Diagnosis Date  . Stroke   . Hypertension   . Diabetes mellitus without complication   . COPD (chronic obstructive pulmonary disease)   . Sleep apnea   . Dementia     Past Surgical History  Procedure Laterality Date  . Vascular surgery      History reviewed. No pertinent family history. Social History:  reports that he has never smoked. He does not have any smokeless tobacco history on file. He reports that he does not drink alcohol or use illicit drugs.  Allergies:  Allergies  Allergen Reactions  . Ciprofloxacin Swelling  . Lisinopril Swelling  . Macrodantin [Nitrofurantoin] Swelling    Medications: I have reviewed the patient's current medications.   Physical Examination: Blood pressure 115/63, pulse 82, temperature 98.1 F (36.7 C), temperature source Oral, resp. rate 19, height 6' (1.829 m), weight 84.687 kg (186 lb 11.2 oz), SpO2 96 %.   Neurological Examination Mental Status: Alert, oriented, thought content appropriate.  Speech fluent without evidence of aphasia.  Able to follow 3 step commands without difficulty. Cranial Nerves: II: Discs flat bilaterally; Visual fields grossly normal, pupils equal, round, reactive to light and accommodation III,IV, VI: ptosis not present, extra-ocular motions intact bilaterally V,VII: smile symmetric, facial light touch sensation  normal bilaterally VIII: hearing normal bilaterally IX,X: gag reflex present XI: bilateral shoulder shrug XII: midline tongue extension Motor: Right : Upper extremity   4+/5    Left:     Upper extremity   4+/5  Lower extremity   4/5     Lower extremity   4/5 Tone and bulk:normal tone throughout; no atrophy noted Sensory: Pinprick and light touch intact throughout, bilaterally Deep Tendon Reflexes: 1+ and symmetric throughout Plantars: Right: downgoing   Left: downgoing Cerebellar: normal finger-to-nose, normal rapid alternating movements and normal heel-to-shin test     Laboratory Studies:  Basic Metabolic Panel:  Recent Labs Lab 12/02/14 1942 12/03/14 0632 12/03/14 0633  NA 132* 134*  --   K 3.6 3.2*  --   CL 90* 95*  --   CO2 29 32  --   GLUCOSE 153* 107*  --   BUN 14 13  --   CREATININE 0.94 0.84 0.84  CALCIUM 9.0 8.5*  --   MG  --   --  1.6*    Liver Function Tests:  Recent Labs Lab 12/02/14 1942  AST 17  ALT 9*  ALKPHOS 55  BILITOT 0.4  PROT 8.8*  ALBUMIN 3.3*   No results for input(s): LIPASE, AMYLASE in the last 168 hours. No results for input(s): AMMONIA in the last 168 hours.  CBC:  Recent Labs Lab 12/02/14 1942 12/03/14 0633  WBC 9.9 9.4  HGB 12.5* 11.5*  HCT 38.2* 35.7*  MCV 79.8* 79.0*  PLT 198 176    Cardiac Enzymes: No results for input(s): CKTOTAL, CKMB, CKMBINDEX, TROPONINI in the  last 168 hours.  BNP: Invalid input(s): POCBNP  CBG:  Recent Labs Lab 12/03/14 1341 12/03/14 1621 12/03/14 2032 12/04/14 0722 12/04/14 1126  GLUCAP 89 94 98 97 130*    Microbiology: No results found for this or any previous visit.  Coagulation Studies: No results for input(s): LABPROT, INR in the last 72 hours.  Urinalysis: No results for input(s): COLORURINE, LABSPEC, PHURINE, GLUCOSEU, HGBUR, BILIRUBINUR, KETONESUR, PROTEINUR, UROBILINOGEN, NITRITE, LEUKOCYTESUR in the last 168 hours.  Invalid input(s): APPERANCEUR  Lipid  Panel: No results found for: CHOL, TRIG, HDL, CHOLHDL, VLDL, LDLCALC  HgbA1C: No results found for: HGBA1C  Urine Drug Screen:  No results found for: LABOPIA, COCAINSCRNUR, LABBENZ, AMPHETMU, THCU, LABBARB  Alcohol Level: No results for input(s): ETH in the last 168 hours.   Imaging: Ct Head Wo Contrast  12/02/2014   CLINICAL DATA:  74 year old male with a history of altered mental status over the past 2 days  EXAM: CT HEAD WITHOUT CONTRAST  TECHNIQUE: Contiguous axial images were obtained from the base of the skull through the vertex without intravenous contrast.  COMPARISON:  None.  FINDINGS: Unremarkable appearance of the calvarium without acute fracture or aggressive lesion.  Unremarkable appearance of the scalp soft tissues.  Unremarkable appearance of the bilateral orbits.  Mastoid air cells are clear.  No significant paranasal sinus disease  No acute intracranial hemorrhage.  No midline shift or mass effect.  Confluent hypodensity at the margins of the lateral ventricles.  Atrophic right caudate head with focal hypodensity in the adjacent brain parenchyma. Expanded appearance of the frontal horn of the right lateral ventricle.  Mild dilation of the ventricular system, with bifrontal diameter measures 3.8 cm. No comparison is available.  Gray-white differentiation is relatively maintained.  IMPRESSION: No CT evidence of acute intracranial hemorrhage midline shift, or mass effect.  Sequela of likely prior intraparenchymal hemorrhage of the right frontal lobe with encephalomalacia of the right caudate head and expansion of the right frontal horn.  Mild expansion of the lateral ventricular system, with confluent hypodensity along the margin of the anterior and posterior horns. Changes may reflect chronic microvascular ischemic disease, however, normal pressure hydrocephalus could have this appearance, and if there is concern for NPH, MRI may be considered.  Signed,  Yvone Neu. Loreta Ave, DO  [facility]  [facility]   Electronically Signed   By: Gilmer Mor D.O.   On: 12/02/2014 21:15   Mr Brain Wo Contrast  12/03/2014   CLINICAL DATA:  74 year old male with generalized weakness, gradually unable to walk over the course of the day. Initial encounter.  EXAM: MRI HEAD WITHOUT CONTRAST  TECHNIQUE: Multiplanar, multiecho pulse sequences of the brain and surrounding structures were obtained without intravenous contrast.  COMPARISON:  Head CT without contrast 12/02/2014. Report of the brain MRI 12/08/2007 (no images available).  FINDINGS: Study is mildly degraded by motion artifact despite repeated imaging attempts.  Cerebral volume is within normal limits for age. No restricted diffusion or evidence of acute infarction.  No ventriculomegaly. There is ex vacuo enlargement of the frontal horns which appears related to chronic lacunar infarcts of the left corona radiata and right basal ganglia. Associated hemosiderin. No midline shift, mass effect, or evidence of intracranial mass lesion. There are small chronic lacunar infarcts also in both cerebellar hemispheres. There is patchy T2 hyperintensity in the pons. Major intracranial vascular flow voids are grossly preserved. No acute intracranial hemorrhage identified. T1 weighted imaging also demonstrates more generalized T1 heterogeneity  in the deep gray matter nuclei including the left thalamus. No other chronic blood products identified. Negative pituitary, cervicomedullary junction and visualized cervical spine.  Visualized paranasal sinuses and mastoids are clear. Grossly normal orbit and scalp soft tissues. Visualized bone marrow signal is within normal limits.  IMPRESSION: 1. Intermittently motion degraded exam with no acute intracranial abnormality. 2. Chronic small vessel ischemia.   Electronically Signed   By: Odessa Fleming M.D.   On: 12/03/2014 14:50   US Carotid Bilateral  12/03/2014   CLINICAL DATA:  History of  stroke. Weakness, hypertension, diabetes.  EXAM: BILATERAL CAROTID DUPLEX ULTRASOUND  TECHNIQUE: Wallace Cullens scale imaging, color Doppler and duplex ultrasound was performed of bilateral carotid and vertebral arteries in the neck.  COMPARISON:  12/10/2007  REVIEW OF SYSTEMS: Quantification of carotid stenosis is based on velocity parameters that correlate the residual internal carotid diameter with NASCET-based stenosis levels, using the diameter of the distal internal carotid lumen as the denominator for stenosis measurement.  The following velocity measurements were obtained:  PEAK SYSTOLIC/END DIASTOLIC  RIGHT  ICA:                     66/14cm/sec  CCA:                     58/13cm/sec  SYSTOLIC ICA/CCA RATIO:  1.2  DIASTOLIC ICA/CCA RATIO: 1.1  ECA:                     95cm/sec  LEFT  ICA:                     79 /12cm/sec  CCA:                     40/9cm/sec  SYSTOLIC ICA/CCA RATIO:  2.0  DIASTOLIC ICA/CCA RATIO: 1.3  ECA:                     490cm/sec  FINDINGS: RIGHT CAROTID ARTERY: Eccentric partially calcified plaque in the bulb and proximal ICA, without high-grade stenosis. Normal waveforms and color Doppler signal.  RIGHT VERTEBRAL ARTERY:  Normal flow direction and waveform.  LEFT CAROTID ARTERY: Eccentric nonocclusive plaque in the proximal common carotid artery. Circumferential somewhat irregular and partially calcified plaque in the carotid bulb involving proximal internal and external carotid arteries. There is markedly elevated peak systolic velocities in the proximal external carotid artery just beyond the plaque. In the ICA, at least mild proximal stenosis but normal waveforms and color Doppler signal.  LEFT VERTEBRAL ARTERY: Normal flow direction and waveform.  IMPRESSION: 1. Bilateral carotid bifurcation and proximal ICA plaque, left greater than right, resulting in less than 50% diameter stenosis. 2. High-grade origin stenosis of the left external carotid artery, of uncertain clinical significance.    Electronically Signed   By: Corlis Leak M.D.   On: 12/03/2014 12:14    Assessment: 74 y.o. male   male brought in by EMS with the complaints of found to have generalized weakness with the decreased aberration noticed since yesterday. According to the patient's wife who is with the patient at this time patient has a baseline dementia but able to take care of himself at home and and ambulates on his own. Since yesterday he was noted to have generalized weakness with inability to walk hence needing walker and the symptoms further worsened later in the evening hence called EMS and brought to the emergency room.  MRI no acute abnormality.  Mental status much improved today and as per family close to baseline - Pt/Ot - no further imaging from neuro stand point - call with questions.     12/04/2014, 3:57 PM

## 2014-12-04 NOTE — Progress Notes (Signed)
Pain noted   0 /10. Pt alert, no complaints of pain or discomfort.  Bed in low position, call bell within reach.  Bed alarms on and functioning.  Assessment done and charted.  Will continue to monitor and do hourly rounding throughout the shift

## 2014-12-04 NOTE — Progress Notes (Signed)
Pt up in the chair, tolerating it well. Wife and family in the room with the pt.

## 2014-12-04 NOTE — Evaluation (Signed)
Physical Therapy Evaluation Patient Details Name: Justin Luna MRN: [id number] DOB: [birth year] Today's Date: 12/04/2014   History of Present Illness  74 yo male with onset of AMS and declined gait was admitted with no acute CT findings of head.  PMHx:  CVA with L hemi, DM, HTN, sleep apnea, dementia  Clinical Impression  Pt was able to walk today with close guard, but is quite different than his PLOF with independent gait and occas use of std walker.  He is living with wife who will not be able to physically assist him, but can go to SNF for a short stay to increase safety with outdoor work including stairs.  He is motivated to be more independent and safe so should be able to transition safely home afterward.    Follow Up Recommendations SNF    Equipment Recommendations  None recommended by PT (await SNF disposition)    Recommendations for Other Services       Precautions / Restrictions Precautions Precautions: Fall (telemetry) Restrictions Weight Bearing Restrictions: No      Mobility  Bed Mobility               General bed mobility comments: up when PT entered  Transfers Overall transfer level: Needs assistance Equipment used: Rolling walker (2 wheeled);1 person hand held assist Transfers: Sit to/from [employer] Sit to Stand: Min assist Stand pivot transfers: Min assist          Ambulation/Gait Ambulation/Gait assistance: Min assist;+2 physical assistance;+2 safety/equipment (close guard chair as pt leans R) Ambulation Distance (Feet): 15 Feet Assistive device: Rolling walker (2 wheeled);1 person hand held assist Gait Pattern/deviations: Step-through pattern;Decreased weight shift to left;Shuffle;Decreased stride length;Wide base of support;Trunk flexed;Drifts right/left Gait velocity: slow Gait velocity interpretation: Below normal speed for age/gender    Stairs            Wheelchair Mobility    Modified Rankin (Stroke Patients  Only)       Balance Overall balance assessment: Needs assistance Sitting-balance support: Feet supported Sitting balance-Leahy Scale: Fair   Postural control: Posterior lean Standing balance support: Bilateral upper extremity supported Standing balance-Leahy Scale: Poor                               Pertinent Vitals/Pain Pain Assessment: No/denies pain    Home Living Family/patient expects to be discharged to:: Skilled nursing facility Living Arrangements: Spouse/significant other                    Prior Function Level of Independence: Independent with assistive device(s)         Comments: uses wife's walker at times     Hand Dominance        Extremity/Trunk Assessment   Upper Extremity Assessment: Generalized weakness           Lower Extremity Assessment: Generalized weakness;LLE deficits/detail   LLE Deficits / Details: 4 to 4+ LLE  Cervical / Trunk Assessment: Normal  Communication   Communication: Expressive difficulties;Other (comment) (Speech low volume and unclear)  Cognition Arousal/Alertness: Lethargic Behavior During Therapy: Flat affect Overall Cognitive Status: History of cognitive impairments - at baseline       Memory: Decreased recall of precautions;Decreased short-term memory              General Comments General comments (skin integrity, edema, etc.): Pt is planning to go to SNF as discussed with PT and wife, as  he is living in a mobile home with 3-4 steps to enter on the front and 5 steps on the back.      Exercises        Assessment/Plan    PT Assessment Patient needs continued PT services  PT Diagnosis Generalized weakness;Hemiplegia non-dominant side   PT Problem List Decreased strength;Decreased balance;Decreased range of motion;Decreased activity tolerance;Decreased mobility;Decreased coordination;Decreased cognition;Decreased knowledge of use of DME;Decreased safety awareness;Decreased knowledge  of precautions;Cardiopulmonary status limiting activity  PT Treatment Interventions DME instruction;Gait training;Stair training;Functional mobility training;Therapeutic activities;Therapeutic exercise;Balance training;Neuromuscular re-education;Cognitive remediation;Patient/family education   PT Goals (Current goals can be found in the Care Plan section) Acute Rehab PT Goals Patient Stated Goal: to go home PT Goal Formulation: With patient/family Time For Goal Achievement: 12/18/14 Potential to Achieve Goals: Good    Frequency Min 2X/week   Barriers to discharge Inaccessible home environment 4-5 steps to enter house    Co-evaluation               End of Session Equipment Utilized During Treatment: Gait belt Activity Tolerance: Patient tolerated treatment well;Patient limited by fatigue Patient left: in chair;with call bell/phone within reach;with chair alarm set Nurse Communication: Mobility status         Time: 1340-1415 PT Time Calculation (min) (ACUTE ONLY): 35 min   Charges:   PT Evaluation $Initial PT Evaluation Tier I: 1 Procedure PT Treatments $Gait Training: 8-22 mins   PT G CodesIvar Drape 2014/12/26, 2:34 PM   Samul Dada, PT MS Acute Rehab Dept. Number: [facility] [id number] and [facility] [phone]

## 2014-12-05 LAB — GLUCOSE, CAPILLARY
GLUCOSE-CAPILLARY: 115 mg/dL — AB (ref 65–99)
GLUCOSE-CAPILLARY: 115 mg/dL — AB (ref 65–99)
GLUCOSE-CAPILLARY: 118 mg/dL — AB (ref 65–99)
Glucose-Capillary: 86 mg/dL (ref 65–99)

## 2014-12-05 MED ORDER — PNEUMOCOCCAL VAC POLYVALENT 25 MCG/0.5ML IJ INJ
0.5000 mL | INJECTION | INTRAMUSCULAR | Status: AC
  Administered 2014-12-06: 0.5 mL via INTRAMUSCULAR
  Filled 2014-12-05: qty 0.5

## 2014-12-05 MED ORDER — CEFTRIAXONE SODIUM IN DEXTROSE 20 MG/ML IV SOLN
1.0000 g | INTRAVENOUS | Status: DC
  Administered 2014-12-05 – 2014-12-06 (×2): 1 g via INTRAVENOUS
  Filled 2014-12-05 (×3): qty 50

## 2014-12-05 NOTE — Progress Notes (Signed)
[facility] - [place] [facility]   PATIENT NAME: Justin Luna    MR#:  [id number]  DATE OF BIRTH:  [birth year]  SUBJECTIVE:  CHIEF COMPLAINT:   Chief Complaint  Patient presents with  . Altered Mental Status  calm and appears oriented today, no complains. Ambulated with walker at home on own. Is confused at baseline. Chronic left weakness from old CVA   As per brother in room- he is very weak.  REVIEW OF SYSTEMS:    Review of Systems  Constitutional: Positive for malaise/fatigue. Negative for fever and chills.  HENT: Negative for congestion and sore throat.   Eyes: Negative for blurred vision and pain.  Respiratory: Negative for cough, sputum production and shortness of breath.   Cardiovascular: Negative for chest pain, palpitations and leg swelling.  Gastrointestinal: Negative for nausea, vomiting, abdominal pain and diarrhea.  Musculoskeletal: Negative for myalgias and joint pain.  Skin: Negative for rash.  Neurological: Negative for dizziness, focal weakness and headaches.  Psychiatric/Behavioral: Positive for memory loss.     DRUG ALLERGIES:   Allergies  Allergen Reactions  . Ciprofloxacin Swelling  . Lisinopril Swelling  . Macrodantin [Nitrofurantoin] Swelling    VITALS:  Blood pressure 160/84, pulse 87, temperature 97.9 F (36.6 C), temperature source Oral, resp. rate 18, height 6' (1.829 m), weight 89.721 kg (197 lb 12.8 oz), SpO2 98 %.  PHYSICAL EXAMINATION:   Physical Exam  GENERAL:  74 y.o.-year-old patient lying in the bed with no acute distress.  EYES: Pupils equal, round, reactive to light and accommodation. No scleral icterus. Extraocular muscles intact.  HEENT: Head atraumatic, normocephalic. Oropharynx and nasopharynx clear.  NECK:  Supple, no jugular venous distention. No thyroid enlargement, no tenderness.  LUNGS: Normal breath sounds bilaterally, no wheezing, rales, rhonchi. No use of accessory muscles of  respiration.  CARDIOVASCULAR: S1, S2 normal. No murmurs, rubs, or gallops.  ABDOMEN: Soft, nontender, nondistended. Bowel sounds present. No organomegaly or mass.  EXTREMITIES: No cyanosis, clubbing or edema b/l.    NEUROLOGIC: Cranial nerves II through XII are intact. Right motor 4+/5. Left 5/5   PSYCHIATRIC: The patient is alert and calm. SKIN: No obvious rash, lesion, or ulcer.    LABORATORY PANEL:   CBC  Recent Labs Lab 12/03/14 0633  WBC 9.4  HGB 11.5*  HCT 35.7*  PLT 176   ------------------------------------------------------------------------------------------------------------------  Chemistries   Recent Labs Lab 12/02/14 1942 12/03/14 0632 12/03/14 0633  NA 132* 134*  --   K 3.6 3.2*  --   CL 90* 95*  --   CO2 29 32  --   GLUCOSE 153* 107*  --   BUN 14 13  --   CREATININE 0.94 0.84 0.84  CALCIUM 9.0 8.5*  --   MG  --   --  1.6*  AST 17  --   --   ALT 9*  --   --   ALKPHOS 55  --   --   BILITOT 0.4  --   --    ------------------------------------------------------------------------------------------------------------------  Cardiac Enzymes No results for input(s): TROPONINI in the last 168 hours. ------------------------------------------------------------------------------------------------------------------  RADIOLOGY:  Mr Brain Wo Contrast  12/03/2014   CLINICAL DATA:  74 year old male with generalized weakness, gradually unable to walk over the course of the day. Initial encounter.  EXAM: MRI HEAD WITHOUT CONTRAST  TECHNIQUE: Multiplanar, multiecho pulse sequences of the brain and surrounding structures were obtained without intravenous contrast.  COMPARISON:  Head CT without contrast 12/02/2014. Report of the  brain MRI 12/08/2007 (no images available).  FINDINGS: Study is mildly degraded by motion artifact despite repeated imaging attempts.  Cerebral volume is within normal limits for age. No restricted diffusion or evidence of acute infarction.  No  ventriculomegaly. There is ex vacuo enlargement of the frontal horns which appears related to chronic lacunar infarcts of the left corona radiata and right basal ganglia. Associated hemosiderin. No midline shift, mass effect, or evidence of intracranial mass lesion. There are small chronic lacunar infarcts also in both cerebellar hemispheres. There is patchy T2 hyperintensity in the pons. Major intracranial vascular flow voids are grossly preserved. No acute intracranial hemorrhage identified. T1 weighted imaging also demonstrates more generalized T1 heterogeneity in the deep gray matter nuclei including the left thalamus. No other chronic blood products identified. Negative pituitary, cervicomedullary junction and visualized cervical spine.  Visualized paranasal sinuses and mastoids are clear. Grossly normal orbit and scalp soft tissues. Visualized bone marrow signal is within normal limits.  IMPRESSION: 1. Intermittently motion degraded exam with no acute intracranial abnormality. 2. Chronic small vessel ischemia.   Electronically Signed   By: Odessa Fleming M.D.   On: 12/03/2014 14:50   US Carotid Bilateral  12/03/2014   CLINICAL DATA:  History of stroke. Weakness, hypertension, diabetes.  EXAM: BILATERAL CAROTID DUPLEX ULTRASOUND  TECHNIQUE: Wallace Cullens scale imaging, color Doppler and duplex ultrasound was performed of bilateral carotid and vertebral arteries in the neck.  COMPARISON:  12/10/2007  REVIEW OF SYSTEMS: Quantification of carotid stenosis is based on velocity parameters that correlate the residual internal carotid diameter with NASCET-based stenosis levels, using the diameter of the distal internal carotid lumen as the denominator for stenosis measurement.  The following velocity measurements were obtained:  PEAK SYSTOLIC/END DIASTOLIC  RIGHT  ICA:                     66/14cm/sec  CCA:                     58/13cm/sec  SYSTOLIC ICA/CCA RATIO:  1.2  DIASTOLIC ICA/CCA RATIO: 1.1  ECA:                     95cm/sec   LEFT  ICA:                     79 /12cm/sec  CCA:                     40/9cm/sec  SYSTOLIC ICA/CCA RATIO:  2.0  DIASTOLIC ICA/CCA RATIO: 1.3  ECA:                     490cm/sec  FINDINGS: RIGHT CAROTID ARTERY: Eccentric partially calcified plaque in the bulb and proximal ICA, without high-grade stenosis. Normal waveforms and color Doppler signal.  RIGHT VERTEBRAL ARTERY:  Normal flow direction and waveform.  LEFT CAROTID ARTERY: Eccentric nonocclusive plaque in the proximal common carotid artery. Circumferential somewhat irregular and partially calcified plaque in the carotid bulb involving proximal internal and external carotid arteries. There is markedly elevated peak systolic velocities in the proximal external carotid artery just beyond the plaque. In the ICA, at least mild proximal stenosis but normal waveforms and color Doppler signal.  LEFT VERTEBRAL ARTERY: Normal flow direction and waveform.  IMPRESSION: 1. Bilateral carotid bifurcation and proximal ICA plaque, left greater than right, resulting in less than 50% diameter stenosis. 2. High-grade origin stenosis of the left external carotid  artery, of uncertain clinical significance.   Electronically Signed   By: Corlis Leak M.D.   On: 12/03/2014 12:14     ASSESSMENT AND PLAN:   * Weakness Ruled out CVA as negative  MRI. As per PT- need SNF placement.  * UTI   UA positive   Also seem to be having enlarged prostate- called urology.   IV rocephin.  * Dementia - Watch for inpatient delirium *HTN Conitnue home meds.  * COPD with chronic resp failure On2 L O2 at home. Continue inhalers  *BPH Start flomax Nurses not able to pass catheter- to get urine sample- though pt is incontinent. call urology- pt is on flomax.  No urinary retention- as per Bladder scan by nurse.  * DM- insulin with coverage.  * HL  All the records are reviewed and case discussed with Care Management/Social Workerr. Management plans discussed with the patient,  family and they are in agreement.  CODE STATUS: FULL CODE  DVT Prophylaxis: SCDs  TOTAL TIME TAKING CARE OF THIS PATIENT: 35 minutes.   POSSIBLE D/C IN 1-2 DAYS, DEPENDING ON CLINICAL CONDITION. Need SNF placement.   Altamese Dilling M.D on 12/05/2014 at 11:18 AM  Between 7am to 6pm - Pager - [phone] After 6pm go to [email or web address] - password [email or web address] [facility]  [facility]  Office  [phone]  CC: Primary care physician; [facility]

## 2014-12-05 NOTE — Consult Note (Signed)
Urology Consult  I have been asked to see the patient by Dr. Karrie Meres , for evaluation and management of BPH, possible UTI.  Chief Complaint: AMS  History of Present Illness: Justin Luna is a 74 y.o. year old admitted with worsening confusion acutely worsened from his baseline dementia along with generalized weakeness.  As part of the workup, a urinalysis was ordered, however, given the patient's history of urinary incontinence, there was some difficulty collecting the specimen. Nursing attempted to straight catheter the patient 2 without success but ultimately were able to catch a voided specimen which was sent for analysis yesterday evening. This is highly concerning for infection and he has since been started on anti-biotics (ceftriaxone).  At somepoint, question of BPH was raised due to inability to straight catheter the patient. He was started on Flomax by the primary physician.  He continues to void frequently but without any control. Bladder scan revealed no significant residual.  No gross hematuria. His creatinine is normal.  Patient is unable to provide any additional history about his voiding symptoms.  He is incontinent at baseline prior to this admission.   Past Medical History  Diagnosis Date  . Stroke   . Hypertension   . Diabetes mellitus without complication   . COPD (chronic obstructive pulmonary disease)   . Sleep apnea   . Dementia     Past Surgical History  Procedure Laterality Date  . Vascular surgery      Home Medications:    Medication List    ASK your doctor about these medications        acetaminophen 500 MG tablet  Commonly known as:  TYLENOL  Take 500 mg by mouth every 6 (six) hours as needed for mild pain.     aspirin EC 81 MG tablet  Take 81 mg by mouth every morning.     clopidogrel 75 MG tablet  Commonly known as:  PLAVIX  Take 75 mg by mouth every morning.     fluticasone 50 MCG/ACT nasal spray  Commonly known as:  FLONASE    Place 1 spray into both nostrils daily as needed (for congestion).     Fluticasone-Salmeterol 250-50 MCG/DOSE Aepb  Commonly known as:  ADVAIR  Inhale 1 puff into the lungs 2 (two) times daily.     lovastatin 40 MG tablet  Commonly known as:  MEVACOR  Take 40 mg by mouth at bedtime.     memantine 10 MG tablet  Commonly known as:  NAMENDA  Take 10 mg by mouth 2 (two) times daily.     metoprolol 50 MG tablet  Commonly known as:  LOPRESSOR  Take 50 mg by mouth 2 (two) times daily.     omeprazole 40 MG capsule  Commonly known as:  PRILOSEC  Take 40 mg by mouth at bedtime.     tiotropium 18 MCG inhalation capsule  Commonly known as:  SPIRIVA  Place 18 mcg into inhaler and inhale every morning.     traMADol 50 MG tablet  Commonly known as:  ULTRAM  Take 50 mg by mouth 2 (two) times daily as needed for moderate pain.     Trospium Chloride 60 MG Cp24  Take 1 capsule by mouth at bedtime.        Allergies:  Allergies  Allergen Reactions  . Ciprofloxacin Swelling  . Lisinopril Swelling  . Macrodantin [Nitrofurantoin] Swelling    History reviewed. No pertinent family history.  Social History:  reports that he  has never smoked. He does not have any smokeless tobacco history on file. He reports that he does not drink alcohol or use illicit drugs.  ROS: Unable to perform review of systems given patient's mental status.  Physical Exam:  Vital signs in last 24 hours: Temp:  [97.8 F (36.6 C)-99.5 F (37.5 C)] 97.9 F (36.6 C) (06/12 1117) Pulse Rate:  [84-87] 87 (06/12 1117) Resp:  [18] 18 (06/12 1117) BP: (136-171)/(78-89) 160/84 mmHg (06/12 1117) SpO2:  [96 %-99 %] 98 % (06/12 1117) Weight:  [197 lb 12.8 oz (89.721 kg)] 197 lb 12.8 oz (89.721 kg) (06/12 0408)  Constitutional:  Alert but does not answer questions appropriately.  No acute distress.  Nods to questions.  HEENT: [place] AT, moist mucus membranes.  Trachea midline, no masses Cardiovascular: No clubbing,  cyanosis, or edema. Respiratory: Normal respiratory effort, lungs clear bilaterally GI: Abdomen is soft, nontender, nondistended, no abdominal masses GU: No CVA tenderness.  Uncircumcised phallus with easily retractable foreskin.  Scrotum unremarkable, non tender, bilateral testicles descended, atrophic.  Rectal exam not indicated given age/ comorbidities. Skin: No rashes, bruises or suspicious lesions Lymph: No cervical or inguinal adenopathy Neurologic: Grossly intact, no focal deficits, moving all 4 extremities Psychiatric: Normal mood and affect   Laboratory Data:   Recent Labs  12/02/14 1942 12/03/14 0633  WBC 9.9 9.4  HGB 12.5* 11.5*  HCT 38.2* 35.7*    Recent Labs  12/02/14 1942 12/03/14 0632 12/03/14 0633  NA 132* 134*  --   K 3.6 3.2*  --   CL 90* 95*  --   CO2 29 32  --   GLUCOSE 153* 107*  --   BUN 14 13  --   CREATININE 0.94 0.84 0.84  CALCIUM 9.0 8.5*  --    Urinalysis    Component Value Date/Time   COLORURINE YELLOW* 12/04/2014 1828   APPEARANCEUR HAZY* 12/04/2014 1828   LABSPEC 1.016 12/04/2014 1828   PHURINE 5.0 12/04/2014 1828   GLUCOSEU NEGATIVE 12/04/2014 1828   HGBUR 1+* 12/04/2014 1828   BILIRUBINUR NEGATIVE 12/04/2014 1828   KETONESUR NEGATIVE 12/04/2014 1828   PROTEINUR 100* 12/04/2014 1828   NITRITE NEGATIVE 12/04/2014 1828   LEUKOCYTESUR TRACE* 12/04/2014 1828    Radiologic Imaging: Mr Brain Wo Contrast  12/03/2014   CLINICAL DATA:  74 year old male with generalized weakness, gradually unable to walk over the course of the day. Initial encounter.  EXAM: MRI HEAD WITHOUT CONTRAST  TECHNIQUE: Multiplanar, multiecho pulse sequences of the brain and surrounding structures were obtained without intravenous contrast.  COMPARISON:  Head CT without contrast 12/02/2014. Report of the brain MRI 12/08/2007 (no images available).  FINDINGS: Study is mildly degraded by motion artifact despite repeated imaging attempts.  Cerebral volume is within normal  limits for age. No restricted diffusion or evidence of acute infarction.  No ventriculomegaly. There is ex vacuo enlargement of the frontal horns which appears related to chronic lacunar infarcts of the left corona radiata and right basal ganglia. Associated hemosiderin. No midline shift, mass effect, or evidence of intracranial mass lesion. There are small chronic lacunar infarcts also in both cerebellar hemispheres. There is patchy T2 hyperintensity in the pons. Major intracranial vascular flow voids are grossly preserved. No acute intracranial hemorrhage identified. T1 weighted imaging also demonstrates more generalized T1 heterogeneity in the deep gray matter nuclei including the left thalamus. No other chronic blood products identified. Negative pituitary, cervicomedullary junction and visualized cervical spine.  Visualized paranasal sinuses and mastoids are clear. Grossly  normal orbit and scalp soft tissues. Visualized bone marrow signal is within normal limits.  IMPRESSION: 1. Intermittently motion degraded exam with no acute intracranial abnormality. 2. Chronic small vessel ischemia.   Electronically Signed   By: Odessa Fleming M.D.   On: 12/03/2014 14:50    Impression/Assessment:  74 year old male with dementia, history of stroke, diabetes, and COPD admitted for worsening functional status including weakness, inability to ambulate, and worsening mental status. Workup for stroke has been negative. UA does appear suspicious for urinary tract infection was certainly can contribute to worsening mental status and weakness.  Nursing unable to place catheter presumably due to BPH although stricture cannot be ruled out. At this point, the patient's renal function is normal and he seems to be emptying his bladder based on bladder scan therefore no concern for overflow incontinence. I feel that there is no indication for urological intervention or further instrumentation at this time.  Additionally, given that the  patient is not able to express any symptoms associated with BPH, I do not feel that there is any indication for Flomax at this time which is used primarily for symptomatic relief or in episodes of retention.     Plan:  -no indication for flomax at this time -treat UTI based on culture and sensitivity data -Please call urology with any questions or concerns, will sign off.  12/05/2014, 1:23 PM  Vanna Scotland,  MD  Thank you for involving me in this patient's care.  Please page with any further questions or concerns.

## 2014-12-05 NOTE — Progress Notes (Signed)
Resting quietly today, pt has moderate hand grips and pedal pushes. Alert this am but sleepy this afternoon. Family at bedside and concerned. Informed that pt did not sleep well during the night and attempted to get OOB. Pt to be moved closer to nurses station to maintain safety. Brother states he spoke with Md and pt will possibly be discharged to [facility] on tomorrow.  Receiving IV antibiotics for UTI. Urologist here and ask that bladder scan be performed to make sure he is emptying his bladder, urine scanned and Md informed. No new orders received. Pt continues to void incontinently. No distress noted. NSR.

## 2014-12-06 DIAGNOSIS — N390 Urinary tract infection, site not specified: Secondary | ICD-10-CM

## 2014-12-06 LAB — GLUCOSE, CAPILLARY
GLUCOSE-CAPILLARY: 80 mg/dL (ref 65–99)
GLUCOSE-CAPILLARY: 79 mg/dL (ref 65–99)
Glucose-Capillary: 74 mg/dL (ref 65–99)
Glucose-Capillary: 73 mg/dL (ref 65–99)
Glucose-Capillary: 97 mg/dL (ref 65–99)
Glucose-Capillary: 127 mg/dL — ABNORMAL HIGH (ref 65–99)

## 2014-12-06 MED ORDER — CEFUROXIME AXETIL 250 MG PO TABS: 250.0000 mg | ORAL_TABLET | Freq: Two times a day (BID) | ORAL | Status: AC

## 2014-12-06 MED ORDER — AMLODIPINE BESYLATE 5 MG PO TABS: 5.0000 mg | ORAL_TABLET | Freq: Every day | ORAL | Status: AC

## 2014-12-06 MED ORDER — AMLODIPINE BESYLATE 5 MG PO TABS
5.0000 mg | ORAL_TABLET | Freq: Every day | ORAL | Status: DC
  Administered 2014-12-06: 5 mg via ORAL
  Filled 2014-12-06: qty 1

## 2014-12-06 NOTE — Clinical Social Work Placement (Signed)
   CLINICAL SOCIAL WORK PLACEMENT  NOTE  Date:  12/06/2014  Patient Details  Name: Justin Luna MRN: [id number] Date of Birth: 01-Feb-1941  Clinical Social Work is seeking post-discharge placement for this patient at the Skilled  Nursing Facility level of care (*CSW will initial, date and re-position this form in  chart as items are completed):  Yes   Patient/family provided with [facility] Clinical Social Work Department's list of facilities offering this level of care within the geographic area requested by the patient (or if unable, by the patient's family).  Yes   Patient/family informed of their freedom to choose among providers that offer the needed level of care, that participate in Medicare, Medicaid or managed care program needed by the patient, have an available bed and are willing to accept the patient.  Yes   Patient/family informed of [facility]'s ownership interest in [facility] and [facility], as well as of the fact that they are under no obligation to receive care at these facilities.  PASRR submitted to EDS on       PASRR number received on       Existing PASRR number confirmed on 12/06/14     FL2 transmitted to all facilities in geographic area requested by pt/family on 12/06/14     FL2 transmitted to all facilities within larger geographic area on       Patient informed that his/her managed care company has contracts with or will negotiate with certain facilities, including the following:   (pt's wife was given SNF packet with this information.)     Yes   Patient/family informed of bed offers received.  Patient chooses bed at [facility]     Physician recommends and patient chooses bed at      Patient to be transferred to [facility] on 12/06/14.  Patient to be transferred to facility by  [facility])     Patient family notified on 12/06/14 of transfer.  Name of family member notified:  Wife  [name]      PHYSICIAN Please sign FL2     Additional Comment:    _______________________________________________ Chauncy Passy, LCSW 12/06/2014, 1:09 PM

## 2014-12-06 NOTE — Discharge Summary (Signed)
[facility] - [address] at [facility]   PATIENT NAME: Justin Luna    MR#:  [id number]  DATE OF BIRTH:  20-Nov-1940  DATE OF ADMISSION:  12/02/2014 ADMITTING PHYSICIAN: Crissie Figures, MD  DATE OF DISCHARGE: 12/06/2014  PRIMARY CARE PHYSICIAN: [facility]    ADMISSION DIAGNOSIS:  Somnolence [R40.0] General weakness [R53.1]  DISCHARGE DIAGNOSIS:  Principal Problem:   UTI (urinary tract infection) Active Problems:   Generalized weakness   Dementia   HTN (hypertension)   COPD (chronic obstructive pulmonary disease)   Sleep apnea   BPH (benign prostatic hyperplasia)   DM (diabetes mellitus)   SECONDARY DIAGNOSIS:   Past Medical History  Diagnosis Date  . Stroke   . Hypertension   . Diabetes mellitus without complication   . COPD (chronic obstructive pulmonary disease)   . Sleep apnea   . Dementia     HOSPITAL COURSE:   * Weakness Ruled out CVA as negative MRI. Neurology consult done. As per PT- need SNF placement. Reason is UTI.  * UTI  UA positive  Also seem to be having enlarged prostate- called urology- no need for flomax or othe work ups as pt still have  spontaneous urination.  IV rocephin given, will give cefuroxime on d/c  * Dementia -  remained stable.  *HTN Conitnued home meds.  * COPD with chronic resp failure On2 L O2 at home. Continue inhalers, no active symptoms.  *BPH Start flomax Nurses not able to pass catheter- to get urine sample- though pt is incontinent. call urology- pt is on flomax. No urinary retention- as per Bladder scan by nurse.  * DM- insulin with coverage. Blood sugar stable.   DISCHARGE CONDITIONS:   Stable.  CONSULTS OBTAINED:  Treatment Team:  Pauletta Browns, MD Vanna Scotland, MD  DRUG ALLERGIES:   Allergies  Allergen Reactions  . Ciprofloxacin Swelling  . Lisinopril Swelling  . Macrodantin [Nitrofurantoin] Swelling    DISCHARGE MEDICATIONS:   Current  Discharge Medication List    START taking these medications   Details  amLODipine (NORVASC) 5 MG tablet Take 1 tablet (5 mg total) by mouth daily. Qty: 30 tablet, Refills: 0    cefUROXime (CEFTIN) 250 MG tablet Take 1 tablet (250 mg total) by mouth 2 (two) times daily with a meal. Qty: 11 tablet, Refills: 0      CONTINUE these medications which have NOT CHANGED   Details  acetaminophen (TYLENOL) 500 MG tablet Take 500 mg by mouth every 6 (six) hours as needed for mild pain.    aspirin EC 81 MG tablet Take 81 mg by mouth every morning.    clopidogrel (PLAVIX) 75 MG tablet Take 75 mg by mouth every morning.    fluticasone (FLONASE) 50 MCG/ACT nasal spray Place 1 spray into both nostrils daily as needed (for congestion).    Fluticasone-Salmeterol (ADVAIR) 250-50 MCG/DOSE AEPB Inhale 1 puff into the lungs 2 (two) times daily.    lovastatin (MEVACOR) 40 MG tablet Take 40 mg by mouth at bedtime.    memantine (NAMENDA) 10 MG tablet Take 10 mg by mouth 2 (two) times daily.    metoprolol (LOPRESSOR) 50 MG tablet Take 50 mg by mouth 2 (two) times daily.    omeprazole (PRILOSEC) 40 MG capsule Take 40 mg by mouth at bedtime.    tiotropium (SPIRIVA) 18 MCG inhalation capsule Place 18 mcg into inhaler and inhale every morning.    Trospium Chloride 60 MG CP24 Take 1 capsule  by mouth at bedtime.      STOP taking these medications     traMADol (ULTRAM) 50 MG tablet          DISCHARGE INSTRUCTIONS:    Follow with PMD in 1-2 weeks.  If you experience worsening of your admission symptoms, develop shortness of breath, life threatening emergency, suicidal or homicidal thoughts you must seek medical attention immediately by calling 911 or calling your MD immediately  if symptoms less severe.  You Must read complete instructions/literature along with all the possible adverse reactions/side effects for all the Medicines you take and that have been prescribed to you. Take any new Medicines  after you have completely understood and accept all the possible adverse reactions/side effects.   Please note  You were cared for by a hospitalist during your hospital stay. If you have any questions about your discharge medications or the care you received while you were in the hospital after you are discharged, you can call the unit and asked to speak with the hospitalist on call if the hospitalist that took care of you is not available. Once you are discharged, your primary care physician will handle any further medical issues. Please note that NO REFILLS for any discharge medications will be authorized once you are discharged, as it is imperative that you return to your primary care physician (or establish a relationship with a primary care physician if you do not have one) for your aftercare needs so that they can reassess your need for medications and monitor your lab values.    Today   CHIEF COMPLAINT:   Chief Complaint  Patient presents with  . Altered Mental Status    HISTORY OF PRESENT ILLNESS:  Justin Luna  is a 74 y.o. male brought in by EMS with the complaints of found to have generalized weakness with the decreased aberration noticed in the morning today. According to the patient's wife who is with the patient at this time patient has a baseline dementia but able to take care of himself at home and and ambulates on his own. This morning he was noted to have generalized weakness with inability to walk hence needing walker and the symptoms further worsened later in the evening hence called EMS and brought to the emergency room. Denies any fever, cough, chest pain, shortness of breath, dizziness or loss of consciousness. No history of any nausea, vomiting, diarrhea, dysuria, abdominal pain. Patient is able to speak but his speech is very soft and slow in response. Denies any focal weakness or numbness. In the emergency room patient was evaluated by the ED physician and was found to  be alert awake and oriented but slow in response with soft speech and neuro examination was nonfocal. Workup in the ED revealed CBC and CMP within normal limits, EKG normal sinus rhythm with ventricular rate of 94 bpm with no acute ST-T changes. CT of the head done negative for any acute intracranial pathology but positive for findings suggestive of sequelae of prior intracerebral hemorrhage and encephalomalacia. Urinalysis is pending at this time. Hospitalist service was consulted for further management. Patient did current time is comfortably resting in the bed, alert awake and oriented 3, following commands but slow in response.   VITAL SIGNS:  Blood pressure 139/74, pulse 77, temperature 98.3 F (36.8 C), temperature source Oral, resp. rate 19, height 6' (1.829 m), weight 83.87 kg (184 lb 14.4 oz), SpO2 99 %.  I/O:    Intake/Output Summary (Last 24 hours) at  12/06/14 1325 Last data filed at 12/06/14 0940  Gross per 24 hour  Intake      0 ml  Output      0 ml  Net      0 ml    PHYSICAL EXAMINATION:  GENERAL: 74 y.o.-year-old patient lying in the bed with no acute distress.  EYES: Pupils equal, round, reactive to light and accommodation. No scleral icterus. Extraocular muscles intact.  HEENT: Head atraumatic, normocephalic. Oropharynx and nasopharynx clear.  NECK: Supple, no jugular venous distention. No thyroid enlargement, no tenderness.  LUNGS: Normal breath sounds bilaterally, no wheezing, rales, rhonchi. No use of accessory muscles of respiration.  CARDIOVASCULAR: S1, S2 normal. No murmurs, rubs, or gallops.  ABDOMEN: Soft, nontender, nondistended. Bowel sounds present. No organomegaly or mass.  EXTREMITIES: No cyanosis, clubbing or edema b/l.  NEUROLOGIC: Cranial nerves II through XII are intact. Right motor 4+/5. Left 5/5  PSYCHIATRIC: The patient is alert and calm. SKIN: No obvious rash, lesion, or ulcer.   DATA REVIEW:   CBC  Recent Labs Lab  12/03/14 0633  WBC 9.4  HGB 11.5*  HCT 35.7*  PLT 176    Chemistries   Recent Labs Lab 12/02/14 1942 12/03/14 0632 12/03/14 0633  NA 132* 134*  --   K 3.6 3.2*  --   CL 90* 95*  --   CO2 29 32  --   GLUCOSE 153* 107*  --   BUN 14 13  --   CREATININE 0.94 0.84 0.84  CALCIUM 9.0 8.5*  --   MG  --   --  1.6*  AST 17  --   --   ALT 9*  --   --   ALKPHOS 55  --   --   BILITOT 0.4  --   --     Cardiac Enzymes No results for input(s): TROPONINI in the last 168 hours.  Microbiology Results  No results found for this or any previous visit.  RADIOLOGY:  No results found.   Management plans discussed with the patient, family and they are in agreement.  CODE STATUS:     Code Status Orders        Start     Ordered   12/03/14 0106  Full code   Continuous     12/03/14 0105      TOTAL TIME TAKING CARE OF THIS PATIENT: 35 minutes.    Altamese Dilling M.D on 12/06/2014 at 1:25 PM  Between 7am to 6pm - Pager - [phone]  After 6pm go to [email or web address] - password [email or web address] [facility]  [facility]  Office  [phone]  CC: Primary care physician; [facility]

## 2014-12-06 NOTE — Discharge Instructions (Signed)
Confusion Confusion is the inability to think with your usual speed or clarity. Confusion may come on quickly or slowly over time. How quickly the confusion comes on depends on the cause. Confusion can be due to any number of causes. CAUSES   Concussion, head injury, or head trauma.  Seizures.  Stroke.  Fever.  Brain tumor.  Age related decreased brain function (dementia).  Heightened emotional states like rage or terror.  Mental illness in which the person loses the ability to determine what is real and what is not (hallucinations).  Infections such as a urinary tract infection (UTI).  Toxic effects from alcohol, drugs, or prescription medicines.  Dehydration and an imbalance of salts in the body (electrolytes).  Lack of sleep.  Low blood sugar (diabetes).  Low levels of oxygen from conditions such as chronic lung disorders.  Drug interactions or other medicine side effects.  Nutritional deficiencies, especially niacin, thiamine, vitamin C, or vitamin B.  Sudden drop in body temperature (hypothermia).  Change in routine, such as when traveling or hospitalized. SIGNS AND SYMPTOMS  People often describe their thinking as cloudy or unclear when they are confused. Confusion can also include feeling disoriented. That means you are unaware of where or who you are. You may also not know what the date or time is. If confused, you may also have difficulty paying attention, remembering, and making decisions. Some people also act aggressively when they are confused.  DIAGNOSIS  The medical evaluation of confusion may include:  Blood and urine tests.  X-rays.  Brain and nervous system tests.  Analyzing your brain waves (electroencephalogram or EEG).  Magnetic resonance imaging (MRI) of your head.  Computed tomography (CT) scan of your head.  Mental status tests in which your health care provider may ask many questions. Some of these questions may seem silly or strange,  but they are a very important test to help diagnose and treat confusion. TREATMENT  An admission to the hospital may not be needed, but a person with confusion should not be left alone. Stay with a family member or friend until the confusion clears. Avoid alcohol, pain relievers, or sedative drugs until you have fully recovered. Do not drive until directed by your health care provider. HOME CARE INSTRUCTIONS  What family and friends can do:  To find out if someone is confused, ask the person to state his or her name, age, and the date. If the person is unsure or answers incorrectly, he or she is confused.  Always introduce yourself, no matter how well the person knows you.  Often remind the person of his or her location.  Place a calendar and clock near the confused person.  Help the person with his or her medicines. You may want to use a pill box, an alarm as a reminder, or give the person each dose as prescribed.  Talk about current events and plans for the day.  Try to keep the environment calm, quiet, and peaceful.  Make sure the person keeps follow-up visits with his or her health care provider. PREVENTION  Ways to prevent confusion:  Avoid alcohol.  Eat a balanced diet.  Get enough sleep.  Take medicine only as directed by your health care provider.  Do not become isolated. Spend time with other people and make plans for your days.  Keep careful watch on your blood sugar levels if you are diabetic. SEEK IMMEDIATE MEDICAL CARE IF:   You develop severe headaches, repeated vomiting, seizures, blackouts, or  slurred speech.  There is increasing confusion, weakness, numbness, restlessness, or personality changes.  You develop a loss of balance, have marked dizziness, feel uncoordinated, or fall.  You have delusions, hallucinations, or develop severe anxiety.  Your family members think you need to be rechecked. Document Released: 07/19/2004 Document Revised: 10/26/2013  Document Reviewed: 07/17/2013 [facility] Patient Information 2015 [place], [place]. This information is not intended to replace advice given to you by your health care provider. Make sure you discuss any questions you have with your health care provider.  Aspiration Precautions Aspiration is the inhaling of a liquid or object into the lungs. Things that can be inhaled into the lungs include:  Food.  Any type of liquid, such as drinks or saliva.  Stomach contents, such as vomit or stomach acid. When these things go into the lungs, damage can occur. Serious complications can then result, such as:  A lung infection (pneumonia).  A collection of pus in the lungs (lung abscess). CAUSES  A decreased level of awareness (consciousness) due to:  Traumatic brain injury or head injury.  Stroke.  Neurological disease.  Seizures.  Decreased or absent gag reflex (inability to cough).  Medical conditions that affect swallowing.  Conditions that affect the food pipe (esophagus) such as a narrowing of the esophagus (esophageal stricture).  Gastroesophageal reflux (GERD). This is also known as acid reflux.  Any type of surgery where you are put under general anesthesia or have sedation.  Drinking large amounts of alcohol.  Taking medication that causes drowsiness, confusion, or weakness.  Aging.  Dental problems.  Having a feeding tube. SYMPTOMS When aspiration occurs, different signs and symptoms can occur, such as:  Coughing (if a person has a cough or gag reflex) after swallowing food or liquids.  Difficulty breathing. This can include things like:  Breathing rapidly.  Breathing very slowly.  Loud breathing.  Hearing "gurgling" lung sounds when a person breaths.  Coughing up phlegm (sputum) that is:  Yellow, tan, or green in color.  Has pieces of food in it.  Bad smelling.  A change in voice (hoarseness) or a "gurgly" sound to the voice.  A change in skin color.  The skin may turn red, or a "bluish" type color because of a lack of oxygen (cyanosis).  Fever.  Eyes watering.  Pain in the chest or back.  Facial grimacing .  A feeling of fullness in the throat or that something is stuck in the throat. DIAGNOSIS  A chest X-ray may be performed. This takes a picture of your lungs. It can show changes in the lungs if aspiration has occurred.  A bronchoscopy may be performed. This is a surgical procedure in which a thin, flexible tube with a camera at the end is inserted into the nose or mouth. The tube is advanced to the lungs so your health care provider can view the lungs and obtain a culture, tissue sample, or remove an aspirated object.  A swallowing evaluation study may be performed to evaluate:  A person's risk of aspiration.  How difficult it is for a person to swallow.  What types of foods are safe for a person to eat. PREVENTION If you are a caregiver to someone who may aspirate, follow the directions below. If you are caring for someone who can eat and drink through their mouth:  Have them sit in an upright position when eating food or drinking fluids, such as:  Sitting up in a chair.  If sitting in a  chair is not possible, position the person in bed so they are upright.  Remind the person to eat slowly and chew well.  Do not distract the person. This is especially important for people with thinking or memory (cognitive) problems.  Check the person's mouth for leftover food after eating.  Keep the person sitting upright for 30 to 45 minutes after eating.  Do not serve food or drink for at least 2 hours before bedtime. If you are caring for someone with a feeding tube and he or she cannot eat or drink through their mouth:  Keep the person in an upright position as much as possible.  Do not  lay the person flat if they are getting continuous feedings. Turn the feeding pump off if you need to lay the person flat for any  reason.  Check feeding tube residuals as directed by your health care provider. If a large amount of tube feedings are pulled back (aspirated) from the feeding tube, call your health care provider right away. General guidelines to prevent aspiration include:  Feed small amounts of food. Do not force feed.  Use as little water as possible when brushing the person's teeth or cleaning his or her mouth.  Provide oral care before and after meals.  Never put food or fluids in the mouth of a person who is not fully alert.  Crush pills and put them in soft food such as pudding or ice cream. Some pills should not be crushed. Check with your health care provider before crushing any medication. SEEK IMMEDIATE MEDICAL CARE IF:   The person has trouble breathing or starts to breathe rapidly.  The person is breathing very slowly or stops breathing.  The person coughs a lot after eating or drinking.  The person has a chronic cough.  The person coughs up thick, yellow, or tan sputum.  The person has a fever or persistent symptoms for more than 72 hours.  The person has a fever and their symptoms suddenly get worse. Document Released: 07/14/2010 Document Revised: 06/16/2013 Document Reviewed: 09/16/2013 [facility] Patient Information 2015 [place], [place]. This information is not intended to replace advice given to you by your health care provider. Make sure you discuss any questions you have with your health care provider.

## 2014-12-06 NOTE — Clinical Social Work Note (Signed)
Clinical Social Work Assessment  Patient Details  Name: Justin Luna MRN: [id number] Date of Birth: 11-Oct-1940  Date of referral:  12/06/14               Reason for consult:  Facility Placement                Permission sought to share information with:  Family Supports, Oceanographer granted to share information::  Yes, Verbal Permission Granted  Name::        Agency::     Relationship::     Contact Information:     Housing/Transportation Living arrangements for the past 2 months:  Single Family Home Source of Information:  Spouse, Other (Comment Required) (sister in Social worker) Patient Interpreter Needed:  None Criminal Activity/Legal Involvement Pertinent to Current Situation/Hospitalization:  No - Comment as needed Significant Relationships:  Spouse, Other Family Members Lives with:  Spouse Do you feel safe going back to the place where you live?  No Need for family participation in patient care:  Yes (Comment)  Care giving concerns:  Pt's wife was concerned about being able to care for the pt at home by herself.  She uses a cane to walk and stated that she would not be able to get the pt up if he were to fall at home.     Social Worker assessment / plan:  CSW spoke to pt's wife Justin Luna [phone].  SHe stated that she would prefer for the pt to come home, but she did not feel she would be able to care for the pt.  She agreed to SNF placement for pt.  CSW attmepted to speak to pt, however he would not say any words, but would nod his head to giver permission for CSW to speak to his family.  He shook his head no when asked if he knew where he was and what the year was.  CSW also spoke to pt's family about supplemental insurance for future medical care costs.    Employment status:  Disabled (Comment on whether or not currently receiving Disability), Retired Health and safety inspector:  Medicare PT Recommendations:  Skilled Nursing Facility Information / Referral  to community resources:     Patient/Family's Response to care:  Pt's wife and sister in law were in agreement with SNF placement for pt.    Patient/Family's Understanding of and Emotional Response to Diagnosis, Current Treatment, and Prognosis: pt's wife and sister in law understood the need for SNF placement.    Emotional Assessment Appearance:  Appears stated age Attitude/Demeanor/Rapport:    Affect (typically observed):   (pt was awake but mostly non verbal with communication.) Orientation:  Oriented to Self Alcohol / Substance use:  Never Used Psych involvement (Current and /or in the community):  No (Comment)  Discharge Needs  Concerns to be addressed:    Readmission within the last 30 days:  No Current discharge risk:  Physical Impairment Barriers to Discharge:      Chauncy Passy, LCSW 12/06/2014, 1:03 PM

## 2014-12-06 NOTE — Progress Notes (Signed)
Physical Therapy Treatment Patient Details Name: Justin Luna MRN: [id number] DOB: 1941/04/08 Today's Date: 12/06/2014    History of Present Illness 74 yo male with onset of AMS and declined gait was admitted with no acute CT findings of head.  PMHx:  CVA with L hemi, DM, HTN, sleep apnea, dementia    PT Comments    Pt is progressing towards goals. He still requires lots of verbal cueing to attend to task and sequence his walker with his gait. Performing gait x 15ft today required mod assist for physical support as well facilitation of step initiation and walker advancement. Standing balance requires mod assist for prevention of posterior falls. Pt tends to lean backwards when standing with walker. Transfers require mod assist for balance and support in rising. PT discharge recommendation remains SNF, as was communicated to pt's wife, which she agreed with.   Follow Up Recommendations  SNF     Equipment Recommendations  None recommended by PT    Recommendations for Other Services       Precautions / Restrictions Precautions Precautions: Fall Restrictions Weight Bearing Restrictions: No    Mobility  Bed Mobility Overal bed mobility: +2 for physical assistance;Needs Assistance Bed Mobility: Supine to Sit;Sit to Supine     Supine to sit: Mod assist (Needs help with trunk control and LE management ) Sit to supine: Mod assist (Needs help with trunk control and LE management)   General bed mobility comments: up when PT entered  Transfers Overall transfer level: Needs assistance Equipment used: Rolling walker (2 wheeled) Transfers: Sit to/from Stand Sit to Stand: Mod assist (Needs help maintaining balance. Falls backwards.)         General transfer comment: Pt requires mod assist for transfers for physical support, especially in the posterior direction as he tends to lean backward  Ambulation/Gait Ambulation/Gait assistance: +2 physical assistance;Mod assist Ambulation  Distance (Feet): 10 Feet Assistive device: Rolling walker (2 wheeled) Gait Pattern/deviations: Decreased step length - right;Decreased step length - left;Step-to pattern;Shuffle Gait velocity: slow   General Gait Details: Very slow, shuffling gait pattern. Requires mod assist +2 for physical support and cueing for sequencing of walker with gait.     Stairs            Wheelchair Mobility    Modified Rankin (Stroke Patients Only)       Balance Overall balance assessment: Needs assistance       Postural control: Posterior lean Standing balance support: Bilateral upper extremity supported                        Cognition Arousal/Alertness: Lethargic Behavior During Therapy: Flat affect Overall Cognitive Status: History of cognitive impairments - at baseline                      Exercises Other Exercises Other Exercises: Pt completed bilateral UE AROM reaching overhead to tap therapists hand. Patient also performed a set of 10 LAQs bilaterally in sitting.     General Comments        Pertinent Vitals/Pain Pain Assessment: No/denies pain    Home Living                      Prior Function            PT Goals (current goals can now be found in the care plan section) Acute Rehab PT Goals Patient Stated Goal: None stated PT Goal Formulation: With  patient/family Time For Goal Achievement: 12/18/14 Potential to Achieve Goals: Good Progress towards PT goals: Progressing toward goals    Frequency  Min 2X/week    PT Plan Current plan remains appropriate    Co-evaluation             End of Session Equipment Utilized During Treatment: Gait belt;Oxygen Activity Tolerance: Patient limited by fatigue Patient left: in bed;with call bell/phone within reach;with bed alarm set;with family/visitor present     Time: 1054-1130 PT Time Calculation (min) (ACUTE ONLY): 36 min  Charges:                       G CodesBenna Dunks 01/01/15, 1:18 PM

## 2014-12-06 NOTE — Clinical Social Work Note (Signed)
CSW notified Facility, RN, pt and pt's family that pt would DC today via EMS to [facility].  CSW signing off.

## 2014-12-06 NOTE — Care Management (Signed)
Anticipate discharge to [facility] this day

## 2014-12-06 NOTE — Plan of Care (Signed)
Problem: Discharge/Transitional Outcomes Goal: Hemodynamically stable Outcome: Progressing [place], Report called to [facility] Patient discharged via non emergency transport

## 2015-01-13 ENCOUNTER — Other Ambulatory Visit: Payer: Self-pay

## 2015-01-13 ENCOUNTER — Telehealth: Payer: Self-pay

## 2015-01-13 NOTE — Telephone Encounter (Signed)
Pt pharmacy sent a refill request for sanctura xl. Per your last note pt did well on medication but could not afford. Please advise.

## 2015-01-14 NOTE — Telephone Encounter (Signed)
Spoke with pt wife in reference to medication refill. Wife stated she can not make an appt right now, but will call back.

## 2015-01-14 NOTE — Telephone Encounter (Signed)
The patient has not been seen in over a year.  He will need an appointment prior to refill.

## 2015-01-17 ENCOUNTER — Emergency Department
Admission: EM | Admit: 2015-01-17 | Discharge: 2015-01-24 | Disposition: E | Payer: Commercial Managed Care - HMO | Attending: Emergency Medicine | Admitting: Emergency Medicine

## 2015-01-17 DIAGNOSIS — R55 Syncope and collapse: Secondary | ICD-10-CM | POA: Diagnosis not present

## 2015-01-17 DIAGNOSIS — Z79899 Other long term (current) drug therapy: Secondary | ICD-10-CM | POA: Insufficient documentation

## 2015-01-17 DIAGNOSIS — Z7982 Long term (current) use of aspirin: Secondary | ICD-10-CM | POA: Diagnosis not present

## 2015-01-17 DIAGNOSIS — I469 Cardiac arrest, cause unspecified: Secondary | ICD-10-CM | POA: Diagnosis not present

## 2015-01-17 DIAGNOSIS — R092 Respiratory arrest: Secondary | ICD-10-CM

## 2015-01-17 DIAGNOSIS — E119 Type 2 diabetes mellitus without complications: Secondary | ICD-10-CM | POA: Diagnosis not present

## 2015-01-17 DIAGNOSIS — I10 Essential (primary) hypertension: Secondary | ICD-10-CM | POA: Insufficient documentation

## 2015-01-17 DIAGNOSIS — Z7951 Long term (current) use of inhaled steroids: Secondary | ICD-10-CM | POA: Insufficient documentation

## 2015-01-18 ENCOUNTER — Encounter: Payer: Self-pay | Admitting: Emergency Medicine

## 2015-01-18 MED ORDER — MAGNESIUM SULFATE 50 % IJ SOLN
INTRAMUSCULAR | Status: DC | PRN
  Administered 2015-01-18: 1 g via INTRAVENOUS

## 2015-01-18 MED ORDER — CALCIUM CHLORIDE 10 % IV SOLN
INTRAVENOUS | Status: DC | PRN
Start: 2015-01-17 — End: 2015-01-18
  Administered 2015-01-17: 1 g via INTRAVENOUS

## 2015-01-18 MED ORDER — ATROPINE SULFATE 1 MG/ML IJ SOLN
INTRAMUSCULAR | Status: DC | PRN
Start: 2015-01-17 — End: 2015-01-18
  Administered 2015-01-17 – 2015-01-18 (×3): 1 mg via INTRAVENOUS

## 2015-01-18 MED ORDER — DOPAMINE-DEXTROSE 3.2-5 MG/ML-% IV SOLN
INTRAVENOUS | Status: DC | PRN
  Administered 2015-01-18: 5 ug/kg/min via INTRAVENOUS

## 2015-01-18 MED ORDER — EPINEPHRINE HCL 0.1 MG/ML IJ SOSY
PREFILLED_SYRINGE | INTRAMUSCULAR | Status: AC | PRN
  Administered 2015-01-17: 1 via INTRAVENOUS

## 2015-01-18 MED ORDER — SODIUM BICARBONATE 8.4 % IV SOLN
INTRAVENOUS | Status: DC | PRN
  Administered 2015-01-17 – 2015-01-18 (×2): 100 meq via INTRAVENOUS

## 2015-01-18 MED ORDER — ATROPINE SULFATE 1 MG/ML IJ SOLN: INTRAMUSCULAR | Status: AC | PRN

## 2015-01-18 MED ORDER — EPINEPHRINE HCL 0.1 MG/ML IJ SOSY
PREFILLED_SYRINGE | INTRAMUSCULAR | Status: DC | PRN
  Administered 2015-01-17 – 2015-01-18 (×9): 1 via INTRAVENOUS

## 2015-01-24 NOTE — Code Documentation (Signed)
Faint pulse at at Left jugular, HR 40, brady wide complex, cardiac activity seen by Dr Zenda Alpers on ultrasound

## 2015-01-24 NOTE — Code Documentation (Signed)
Family updated as to patient's status.  Family is leaving pt's room

## 2015-01-24 NOTE — Code Documentation (Signed)
No pulse 

## 2015-01-24 NOTE — ED Provider Notes (Signed)
[facility] Emergency Department Provider Note  ____________________________________________  Time seen: Approximately 2340 AM  I have reviewed the triage vital signs and the nursing notes.   HISTORY  Chief Complaint Cardiac Arrest  History Limited by patient who is unconscious and unresponsive  HPI Justin Luna is a 74 y.o. male who comes in with cardiac arrest. According to EMS the patient was walking down the hallway and passed out. The patient's wife was able to catch him before he hit his head and she called the paramedics. The fire department arrived first and when they evaluated the patient per EMS the patient was breathing only 6 times a minute and was tachycardic. Per EMS as they were loading him onto the stretcher and taking him to the ambulance he became apneic. They report that they started bagging him and his heart rate also decreased significantly with no pulses. Per EMS they gave the patient 4 doses of epinephrine through an intraosseous line as they were unable to obtain an IV. EMS reports that the patient's heart rate improved to the 140s and his blood pressure also improved to 118/76. The patient had a King airway placed and was brought in for further evaluation.   Past Medical History  Diagnosis Date  . Stroke   . Hypertension   . Diabetes mellitus without complication   . COPD (chronic obstructive pulmonary disease)   . Sleep apnea   . Dementia     Patient Active Problem List   Diagnosis Date Noted  . UTI (urinary tract infection) 12/06/2014  . Generalized weakness 12/02/2014  . Dementia 12/02/2014  . HTN (hypertension) 12/02/2014  . COPD (chronic obstructive pulmonary disease) 12/02/2014  . Sleep apnea 12/02/2014  . BPH (benign prostatic hyperplasia) 12/02/2014  . DM (diabetes mellitus) 12/02/2014    Past Surgical History  Procedure Laterality Date  . Vascular surgery      Current Outpatient Rx  Name  Route  Sig  Dispense   Refill  . acetaminophen (TYLENOL) 500 MG tablet   Oral   Take 500 mg by mouth every 6 (six) hours as needed for mild pain.         Marland Kitchen amLODipine (NORVASC) 5 MG tablet   Oral   Take 1 tablet (5 mg total) by mouth daily.   30 tablet   0   . aspirin EC 81 MG tablet   Oral   Take 81 mg by mouth every morning.         . clopidogrel (PLAVIX) 75 MG tablet   Oral   Take 75 mg by mouth every morning.         . finasteride (PROSCAR) 5 MG tablet   Oral   Take 1 tablet by mouth daily.      5   . fluticasone (FLONASE) 50 MCG/ACT nasal spray   Each Nare   Place 1 spray into both nostrils daily as needed (for congestion).         . Fluticasone-Salmeterol (ADVAIR) 250-50 MCG/DOSE AEPB   Inhalation   Inhale 1 puff into the lungs 2 (two) times daily.         Marland Kitchen lovastatin (MEVACOR) 40 MG tablet   Oral   Take 40 mg by mouth at bedtime.         . memantine (NAMENDA) 10 MG tablet   Oral   Take 10 mg by mouth 2 (two) times daily.         . metoprolol (LOPRESSOR) 50 MG  tablet   Oral   Take 50 mg by mouth 2 (two) times daily.         Marland Kitchen omeprazole (PRILOSEC) 40 MG capsule   Oral   Take 40 mg by mouth at bedtime.         Marland Kitchen tiotropium (SPIRIVA) 18 MCG inhalation capsule   Inhalation   Place 18 mcg into inhaler and inhale every morning.         . Trospium Chloride 60 MG CP24   Oral   Take 1 capsule by mouth at bedtime.           Allergies Ciprofloxacin; Lisinopril; and Macrodantin  No family history on file.  Social History History  Substance Use Topics  . Smoking status: Never Smoker   . Smokeless tobacco: Not on file  . Alcohol Use: No    Review of Systems  Unable to obtain as the patient is unresponsive  ____________________________________________   PHYSICAL EXAM:  VITAL SIGNS: ED Triage Vitals  Enc Vitals Group     BP 19-Jan-2015 0002 56/25 mmHg     Pulse Rate 01/19/15 0002 93     Resp 2015/01/19 0002 12     Temp --      Temp src --       SpO2 --      Weight --      Height --      Head Cir --      Peak Flow --      Pain Score --      Pain Loc --      Pain Edu? --      Excl. in GC? --     Constitutional: Unresponsive  Eyes: Pupils are 3 mm and nonreactive bilaterally Head: Atraumatic. Mouth/Throat: Mucous membranes are dry Cardiovascular: No heart beat auscultated Respiratory: Good lungs sounds with bagging bilaterally Gastrointestinal: Soft and distention. No Bowel sounds Genitourinary: Deferred Musculoskeletal: No lower extremity edema Neurologic:  Patient not responsive to pain or any stimuli GCS of 3 patient with no gag reflex patient with no corneal reflex Skin:  Skin is cool and dry and intact Psychiatric: Patient unresponsive  ____________________________________________   LABS (all labs ordered are listed, but only abnormal results are displayed)  Labs Reviewed - No data to display ____________________________________________  EKG  ED ECG REPORT I, Rebecka Apley, the attending physician, personally viewed and interpreted this ECG.   Date: Jan 19, 2015  EKG Time: 0001  Rate: 104  Rhythm: sinus tachycardia  Axis: Normal  Intervals:none widened QRS normal QTC  ST&T Change: Lots of artifact, no ST segment elevation or depression noted  ED ECG REPORT #2 I, Rebecka Apley, the attending physician, personally viewed and interpreted this ECG.   Date: January 19, 2015  EKG Time: 0001  Rate: 93  Rhythm: atrial fibrillation, rate 93  Axis: Normal  Intervals:none  ST&T Change: Some ST segment elevation in lead 2 with a widened QRS, aphasic T waves in lead V3  ____________________________________________  RADIOLOGY  None ____________________________________________   PROCEDURES  Procedure(s) performed: Please, see procedure note(s).   INTUBATION Performed by: Lucrezia Europe P  Required items: required blood products, implants, devices, and special equipment available Patient  identity confirmed: provided demographic data and hospital-assigned identification number Time out: Immediately prior to procedure a "time out" was called to verify the correct patient, procedure, equipment, support staff and site/side marked as required.  Indications: Unresponsive, CPR  Intubation method: Glidescope Laryngoscopy   Preoxygenation: BVM  Tube Size: 7.5cuffed  Post-procedure assessment: chest rise and ETCO2 monitor Breath sounds: equal and absent over the epigastrium Tube secured with: tape  Patient tolerated the procedure well with no immediate complications.   Cardiopulmonary Resuscitation (CPR) Procedure Note Directed/Performed by: Rebecka Apley I personally directed ancillary staff and/or performed CPR in an effort to regain return of spontaneous circulation and to maintain cardiac, neuro and systemic perfusion.    Critical Care performed: Yes, see critical care note(s)   CRITICAL CARE Performed by: Lucrezia Europe P   Total critical care time: 60 minutes  Critical care time was exclusive of separately billable procedures and treating other patients.  Critical care was necessary to treat or prevent imminent or life-threatening deterioration.  Critical care was time spent personally by me on the following activities: development of treatment plan with patient and/or surrogate as well as nursing, discussions with consultants, evaluation of patient's response to treatment, examination of patient, obtaining history from patient or surrogate, ordering and performing treatments and interventions, ordering and review of laboratory studies, ordering and review of radiographic studies, pulse oximetry and re-evaluation of patient's condition.  ____________________________________________   INITIAL IMPRESSION / ASSESSMENT AND PLAN / ED COURSE  Pertinent labs & imaging results that were available during my care of the patient were reviewed by me and considered  in my medical decision making (see chart for details).  This is a 74 year old male who came in after passing out at home and having CPR arrest. When the patient arrived to the emergency department his heart rate was in the 50s and no palpable pulses were noted. At that time we did start CPR giving the patient epinephrine, atropine, sodium bicarbonate. At one point the patient went into V. tach and sustained 2 shocks. He obtained a return of spontaneous circulation and was started on dopamine but the patient's heart rate continued to decrease and he again had no pulses so CPR was resumed. Despite 60 minutes of CPR and medication efforts we were unable to resuscitate the patient. Please see the CPR nurses note to obtain specific medications and times of administration. I discussed the efforts with the patient's family at this time there declining an autopsy. ____________________________________________   FINAL CLINICAL IMPRESSION(S) / ED DIAGNOSES  Final diagnoses:  Cardiac arrest  Syncope, unspecified syncope type  Respiratory arrest      Rebecka Apley, MD 01/17/2015 [phone]

## 2015-01-24 NOTE — Code Documentation (Addendum)
Vtach - shocked [id number]

## 2015-01-24 NOTE — Code Documentation (Signed)
PEA, HR47, RR14 bagged,

## 2015-01-24 NOTE — Code Documentation (Signed)
Pulse check = none , asystole notetd

## 2015-01-24 NOTE — Code Documentation (Signed)
Family updated as to patient's status.

## 2015-01-24 NOTE — Code Documentation (Signed)
Sinus tach, pulse at right femoral

## 2015-01-24 NOTE — Code Documentation (Signed)
PEA, HR 46, RR 18 bagged,

## 2015-01-24 NOTE — ED Notes (Signed)
Patient presents to Emergency Department via EMS with complaints of pt fell backwards hitting his head on the floor, EMS reports that the wife attempted to catch pt "easing him to floor", EMS reports first EKG at 2239, "4 rounds of CPR with 4 x Epi", EMS reports not shocking pt, reports pt hx of CVA approx [age] y/o, HTN, current UTI, and boaderline diabetic (CBG 133), IO in right knee.

## 2015-01-24 NOTE — Code Documentation (Signed)
PEA.

## 2015-01-24 NOTE — Code Documentation (Signed)
BP 127/91, 132HR, 28RR, sinus tach with pulse

## 2015-01-24 NOTE — Code Documentation (Addendum)
Vital signs : BP: 56/25, HR 93, RR 12

## 2015-01-24 NOTE — Code Documentation (Signed)
HR: 45, brady with no pulse found, compression resumed

## 2015-01-24 NOTE — Code Documentation (Signed)
Pulse at right femoral

## 2015-01-24 NOTE — Code Documentation (Signed)
Organ procurement team notified.  [address], [place], [phone]

## 2015-01-24 NOTE — Code Documentation (Signed)
V fib - 150J shock

## 2015-01-24 DEATH — deceased

## 2016-11-14 IMAGING — MR MR HEAD W/O CM
10 series · 41 of 48 positions shown · non-contrast
Comparison: Head CT without contrast 12/02/2014. Report of the
brain MRI 12/08/2007 (no images available).

CLINICAL DATA: 74-year-old male with generalized weakness,
gradually unable to walk over the course of the day. Initial
encounter.

EXAM:
MRI HEAD WITHOUT CONTRAST
TECHNIQUE: Multiplanar, multiecho pulse sequences of the brain and surrounding
structures were obtained without intravenous contrast.

[Series 2: T1 · sagittal · 5.0mm · 0.45mm/px · 2 of 25 slices shown]
[im 1/25]
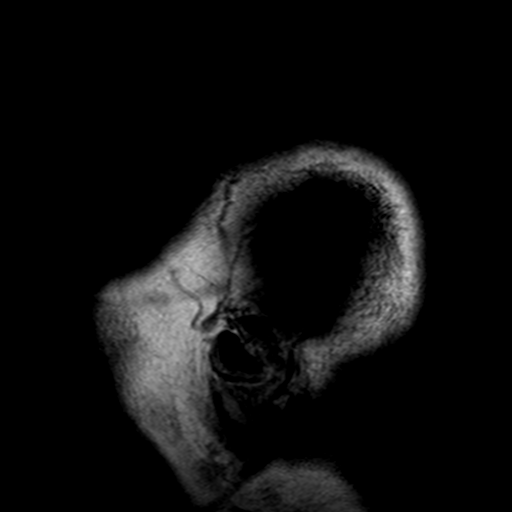
[im 25/25]
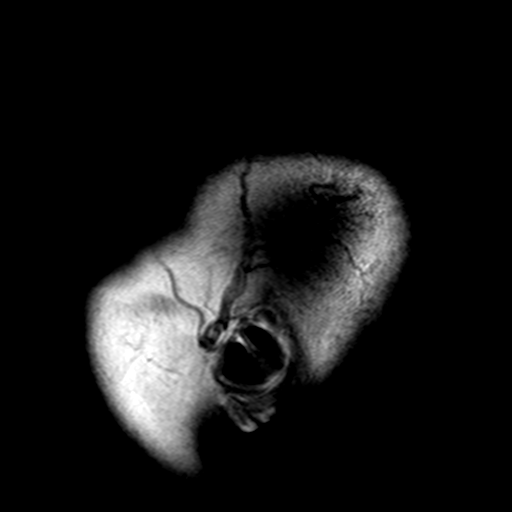

[Series 4: DWI · axial · 4.0mm · 0.94mm/px · z∈[-45,+120]mm · 5 of 45 slices shown (1 of 4)]
[im 1/45]
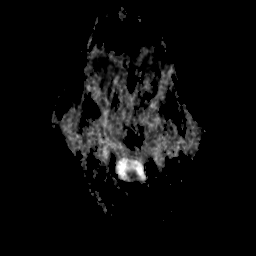
[im 12/45]
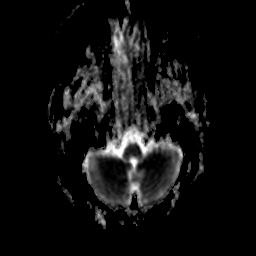
[im 23/45]
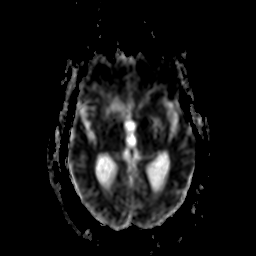
[im 34/45]
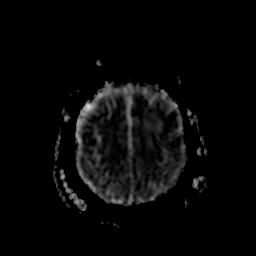
[im 45/45]
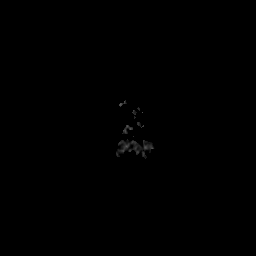

[Series 5: DWI · axial · 4.0mm · 0.94mm/px · z∈[-45,+120]mm · 5 of 44 slices shown (2 of 4)]
[im 1/44]
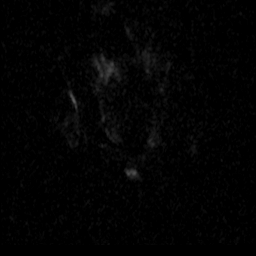
[im 11/44]
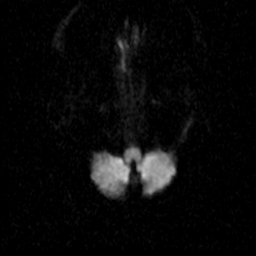
[im 22/44]
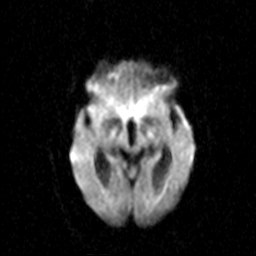
[im 33/44]
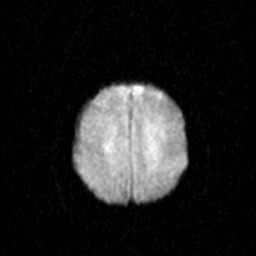
[im 44/44]
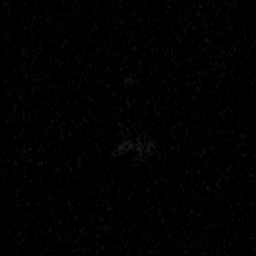

[Series 6: DWI · coronal · 5.0mm · 1.80mm/px · 9 of 114 slices shown (3 of 4)]
[im 1/114]
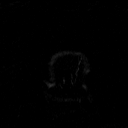
[im 19/114]
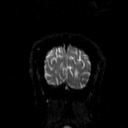
[im 38/114]
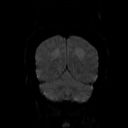
[im 48/114]
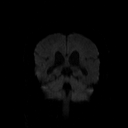
[im 57/114]
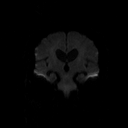
[im 66/114]
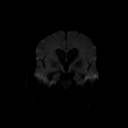
[im 76/114]
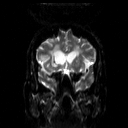
[im 95/114]
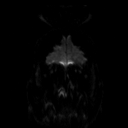
[im 114/114]
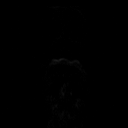

[Series 8: DWI · coronal · 5.0mm · 1.80mm/px · 4 of 38 slices shown (4 of 4)]
[im 1/38]
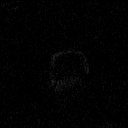
[im 13/38]
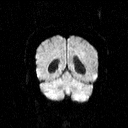
[im 25/38]
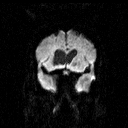
[im 38/38]
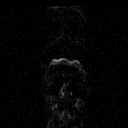

[Series 13: T2 · axial · 5.0mm · 0.68mm/px · z∈[-52,+97]mm · 3 of 25 slices shown (1 of 3)]
[im 1/25]
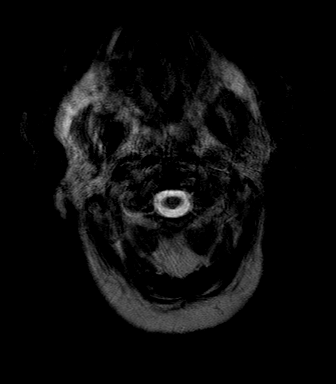
[im 13/25]
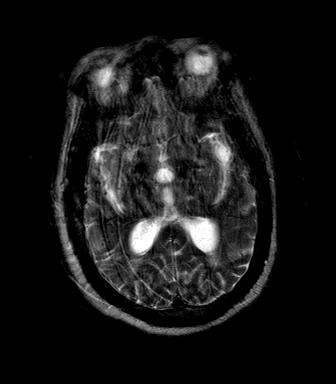
[im 25/25]
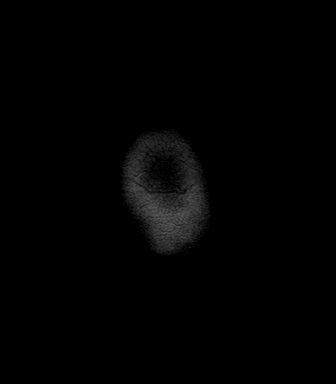

[Series 14: T1 dynamic · axial · 3.0mm · 0.38mm/px · z∈[-65,+18]mm · 4 of 60 slices shown]
[im 1/60]
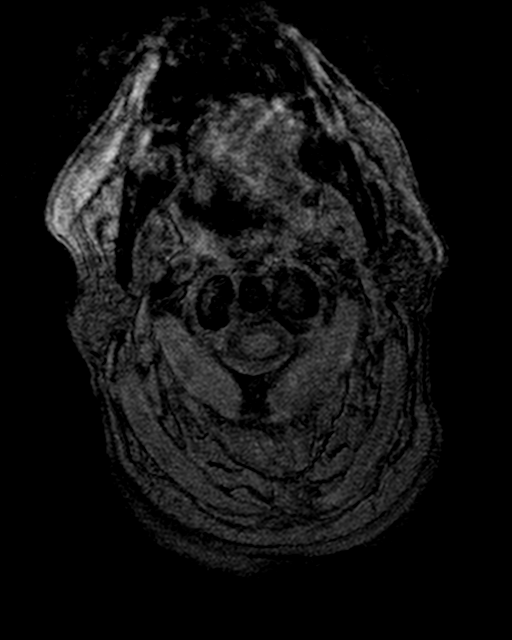
[im 10/60]
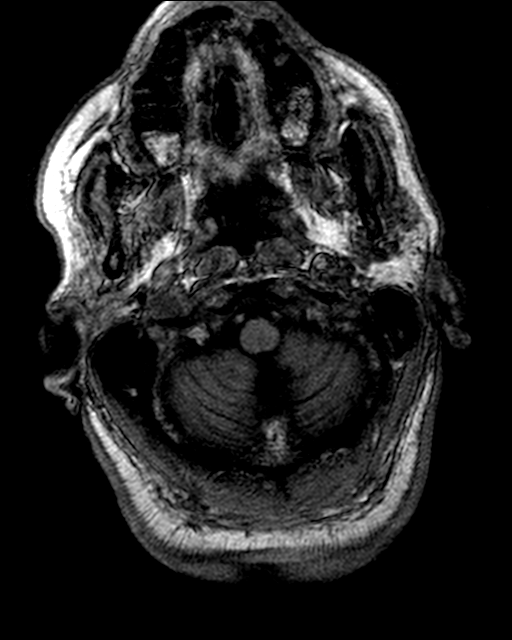
[im 20/60]
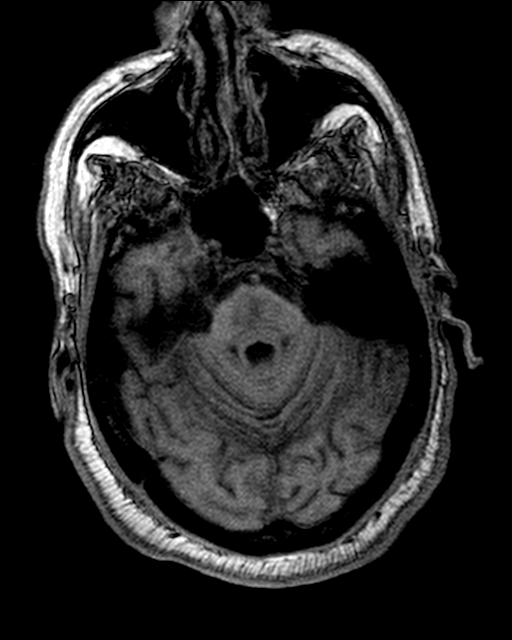
[im 30/60]
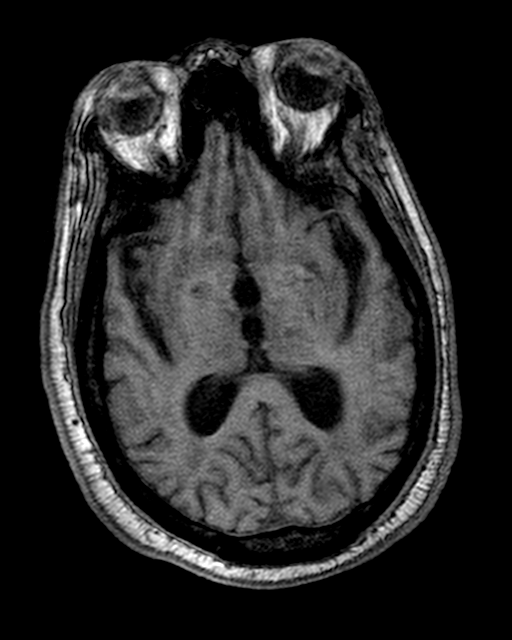

[Series 15: T2 · axial · 5.0mm · 0.45mm/px · z∈[-56,+92]mm · 3 of 25 slices shown (2 of 3)]
[im 1/25]
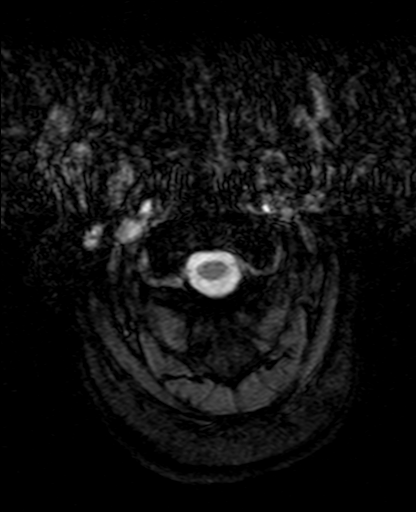
[im 13/25]
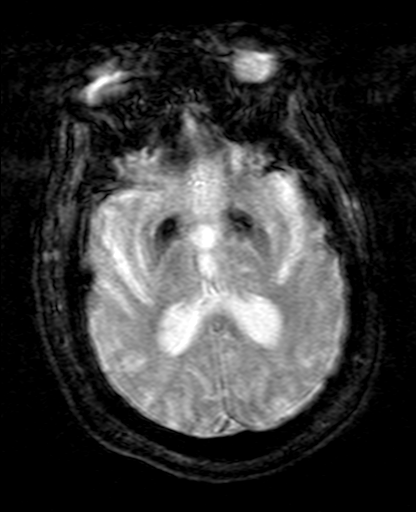
[im 25/25]
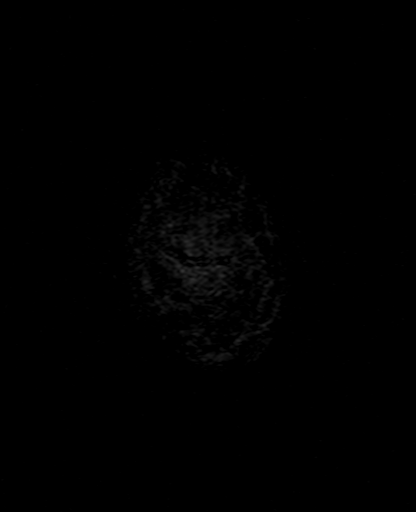

[Series 16: T2 · coronal · 5.0mm · 0.51mm/px · 3 of 30 slices shown (3 of 3)]
[im 1/30]
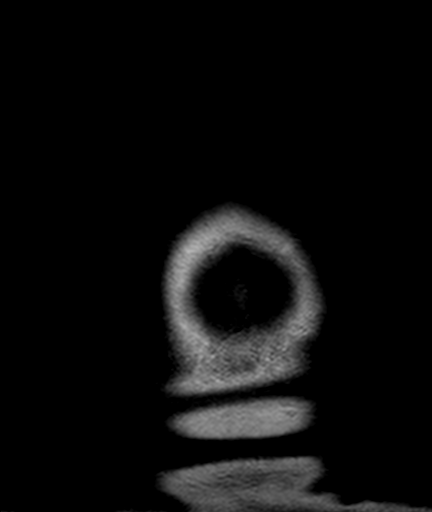
[im 15/30]
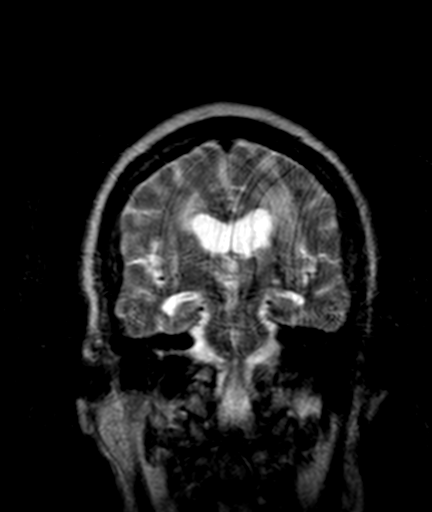
[im 30/30]
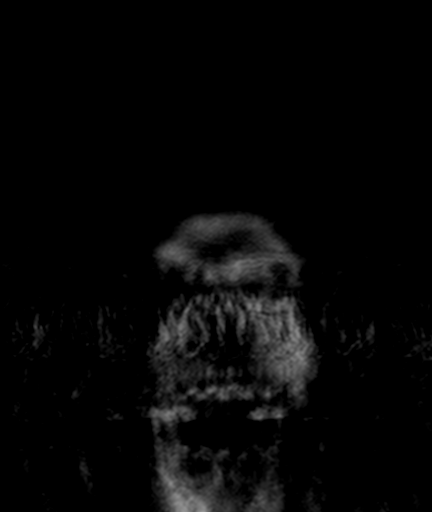

[Series 17: FLAIR · axial · 5.0mm · 0.90mm/px · z∈[-57,+92]mm · 3 of 25 slices shown]
[im 1/25]
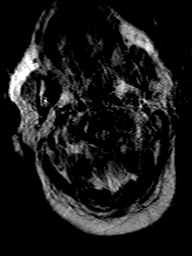
[im 13/25]
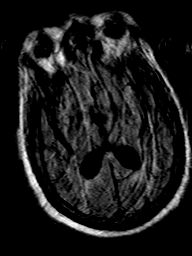
[im 25/25]
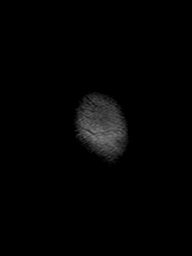

[41 of 48 positions shown; findings below may reference images not displayed]

FINDINGS: Study is mildly degraded by motion artifact despite repeated imaging
attempts.

Cerebral volume is within normal limits for age. No restricted
diffusion or evidence of acute infarction.

No ventriculomegaly. There is ex vacuo enlargement of the frontal
horns which appears related to chronic lacunar infarcts of the left
corona radiata and right basal ganglia. Associated hemosiderin. No
midline shift, mass effect, or evidence of intracranial mass lesion.
There are small chronic lacunar infarcts also in both cerebellar
hemispheres. There is patchy T2 hyperintensity in the pons. Major
intracranial vascular flow voids are grossly preserved. No acute
intracranial hemorrhage identified. T1 weighted imaging also
demonstrates more generalized T1 heterogeneity in the deep gray
matter nuclei including the left thalamus. No other chronic blood
products identified. Negative pituitary, cervicomedullary junction
and visualized cervical spine.

Visualized paranasal sinuses and mastoids are clear. Grossly normal
orbit and scalp soft tissues. Visualized bone marrow signal is
within normal limits.
IMPRESSION: 1. Intermittently motion degraded exam with no acute intracranial
abnormality.
2. Chronic small vessel ischemia.
